# Patient Record
Sex: Male | Born: 1950 | Race: Black or African American | Hispanic: No | Marital: Married | Smoking: Current some day smoker
Health system: Southern US, Community
[De-identification: ages and names within clinical notes are randomized; demographics above are authoritative.]

## PROBLEM LIST (undated history)

## (undated) DIAGNOSIS — K759 Inflammatory liver disease, unspecified: Secondary | ICD-10-CM

## (undated) DIAGNOSIS — A159 Respiratory tuberculosis unspecified: Secondary | ICD-10-CM

## (undated) DIAGNOSIS — F1910 Other psychoactive substance abuse, uncomplicated: Secondary | ICD-10-CM

## (undated) DIAGNOSIS — A630 Anogenital (venereal) warts: Secondary | ICD-10-CM

## (undated) DIAGNOSIS — J42 Unspecified chronic bronchitis: Secondary | ICD-10-CM

## (undated) DIAGNOSIS — J189 Pneumonia, unspecified organism: Secondary | ICD-10-CM

## (undated) DIAGNOSIS — B20 Human immunodeficiency virus [HIV] disease: Secondary | ICD-10-CM

## (undated) DIAGNOSIS — M1990 Unspecified osteoarthritis, unspecified site: Secondary | ICD-10-CM

## (undated) HISTORY — PX: OTHER SURGICAL HISTORY: SHX169

---

## 1997-06-06 ENCOUNTER — Encounter (INDEPENDENT_AMBULATORY_CARE_PROVIDER_SITE_OTHER): Payer: Self-pay | Admitting: *Deleted

## 1997-06-06 LAB — CONVERTED CEMR LAB
CD4 Count: 130 microliters
CD4 T Cell Abs: 130

## 1997-09-08 ENCOUNTER — Encounter: Admission: RE | Admit: 1997-09-08 | Discharge: 1997-09-08 | Payer: Self-pay | Admitting: Internal Medicine

## 1997-10-06 ENCOUNTER — Encounter: Admission: RE | Admit: 1997-10-06 | Discharge: 1997-10-06 | Payer: Self-pay | Admitting: Internal Medicine

## 1997-12-09 ENCOUNTER — Encounter: Admission: RE | Admit: 1997-12-09 | Discharge: 1997-12-09 | Payer: Self-pay | Admitting: Internal Medicine

## 1998-07-22 ENCOUNTER — Encounter: Payer: Self-pay | Admitting: Internal Medicine

## 1998-07-22 ENCOUNTER — Inpatient Hospital Stay (HOSPITAL_COMMUNITY): Admission: RE | Admit: 1998-07-22 | Discharge: 1998-07-25 | Payer: Self-pay | Admitting: Internal Medicine

## 1998-07-22 ENCOUNTER — Encounter: Admission: RE | Admit: 1998-07-22 | Discharge: 1998-07-22 | Payer: Self-pay | Admitting: Internal Medicine

## 1999-03-02 ENCOUNTER — Ambulatory Visit (HOSPITAL_COMMUNITY): Admission: RE | Admit: 1999-03-02 | Discharge: 1999-03-02 | Payer: Self-pay | Admitting: Internal Medicine

## 2000-08-11 ENCOUNTER — Encounter: Admission: RE | Admit: 2000-08-11 | Discharge: 2000-08-11 | Payer: Self-pay | Admitting: Internal Medicine

## 2000-08-23 ENCOUNTER — Ambulatory Visit (HOSPITAL_COMMUNITY): Admission: RE | Admit: 2000-08-23 | Discharge: 2000-08-23 | Payer: Self-pay | Admitting: Internal Medicine

## 2000-12-06 ENCOUNTER — Encounter: Admission: RE | Admit: 2000-12-06 | Discharge: 2000-12-06 | Payer: Self-pay | Admitting: Internal Medicine

## 2000-12-09 ENCOUNTER — Emergency Department (HOSPITAL_COMMUNITY): Admission: EM | Admit: 2000-12-09 | Discharge: 2000-12-09 | Payer: Self-pay | Admitting: Emergency Medicine

## 2000-12-11 ENCOUNTER — Emergency Department (HOSPITAL_COMMUNITY): Admission: EM | Admit: 2000-12-11 | Discharge: 2000-12-11 | Payer: Self-pay | Admitting: Emergency Medicine

## 2000-12-26 ENCOUNTER — Emergency Department (HOSPITAL_COMMUNITY): Admission: EM | Admit: 2000-12-26 | Discharge: 2000-12-26 | Payer: Self-pay

## 2001-04-10 ENCOUNTER — Emergency Department (HOSPITAL_COMMUNITY): Admission: EM | Admit: 2001-04-10 | Discharge: 2001-04-10 | Payer: Self-pay | Admitting: Emergency Medicine

## 2001-04-10 ENCOUNTER — Encounter: Payer: Self-pay | Admitting: Emergency Medicine

## 2001-06-18 ENCOUNTER — Encounter: Admission: RE | Admit: 2001-06-18 | Discharge: 2001-06-18 | Payer: Self-pay | Admitting: Internal Medicine

## 2002-02-19 ENCOUNTER — Ambulatory Visit (HOSPITAL_COMMUNITY): Admission: RE | Admit: 2002-02-19 | Discharge: 2002-02-19 | Payer: Self-pay | Admitting: Internal Medicine

## 2002-02-19 ENCOUNTER — Encounter: Admission: RE | Admit: 2002-02-19 | Discharge: 2002-02-19 | Payer: Self-pay | Admitting: Internal Medicine

## 2002-03-05 ENCOUNTER — Encounter: Admission: RE | Admit: 2002-03-05 | Discharge: 2002-03-05 | Payer: Self-pay | Admitting: Internal Medicine

## 2002-10-15 ENCOUNTER — Encounter: Admission: RE | Admit: 2002-10-15 | Discharge: 2002-10-15 | Payer: Self-pay | Admitting: Internal Medicine

## 2002-10-15 ENCOUNTER — Encounter: Payer: Self-pay | Admitting: Internal Medicine

## 2002-12-03 ENCOUNTER — Encounter: Admission: RE | Admit: 2002-12-03 | Discharge: 2002-12-03 | Payer: Self-pay | Admitting: Internal Medicine

## 2002-12-28 ENCOUNTER — Emergency Department (HOSPITAL_COMMUNITY): Admission: EM | Admit: 2002-12-28 | Discharge: 2002-12-28 | Payer: Self-pay | Admitting: Emergency Medicine

## 2003-02-04 ENCOUNTER — Encounter: Admission: RE | Admit: 2003-02-04 | Discharge: 2003-02-04 | Payer: Self-pay | Admitting: Internal Medicine

## 2003-02-18 ENCOUNTER — Encounter: Admission: RE | Admit: 2003-02-18 | Discharge: 2003-02-18 | Payer: Self-pay | Admitting: Internal Medicine

## 2003-06-03 ENCOUNTER — Encounter: Admission: RE | Admit: 2003-06-03 | Discharge: 2003-06-03 | Payer: Self-pay | Admitting: Internal Medicine

## 2003-07-04 ENCOUNTER — Encounter: Admission: RE | Admit: 2003-07-04 | Discharge: 2003-07-04 | Payer: Self-pay | Admitting: Internal Medicine

## 2003-07-15 ENCOUNTER — Encounter: Admission: RE | Admit: 2003-07-15 | Discharge: 2003-07-15 | Payer: Self-pay | Admitting: Internal Medicine

## 2003-07-29 ENCOUNTER — Encounter: Admission: RE | Admit: 2003-07-29 | Discharge: 2003-07-29 | Payer: Self-pay | Admitting: Internal Medicine

## 2004-08-24 ENCOUNTER — Ambulatory Visit: Payer: Self-pay | Admitting: Internal Medicine

## 2004-08-24 ENCOUNTER — Ambulatory Visit (HOSPITAL_COMMUNITY): Admission: RE | Admit: 2004-08-24 | Discharge: 2004-08-24 | Payer: Self-pay | Admitting: Internal Medicine

## 2004-09-21 ENCOUNTER — Ambulatory Visit: Payer: Self-pay | Admitting: Internal Medicine

## 2004-09-26 ENCOUNTER — Emergency Department (HOSPITAL_COMMUNITY): Admission: EM | Admit: 2004-09-26 | Discharge: 2004-09-26 | Payer: Self-pay | Admitting: Emergency Medicine

## 2004-11-03 ENCOUNTER — Ambulatory Visit: Payer: Self-pay | Admitting: Internal Medicine

## 2004-11-03 ENCOUNTER — Ambulatory Visit (HOSPITAL_COMMUNITY): Admission: RE | Admit: 2004-11-03 | Discharge: 2004-11-03 | Payer: Self-pay | Admitting: Internal Medicine

## 2004-11-16 ENCOUNTER — Ambulatory Visit: Payer: Self-pay | Admitting: Internal Medicine

## 2005-03-08 ENCOUNTER — Encounter (INDEPENDENT_AMBULATORY_CARE_PROVIDER_SITE_OTHER): Payer: Self-pay | Admitting: *Deleted

## 2005-03-08 ENCOUNTER — Ambulatory Visit (HOSPITAL_COMMUNITY): Admission: RE | Admit: 2005-03-08 | Discharge: 2005-03-08 | Payer: Self-pay | Admitting: Internal Medicine

## 2005-03-08 ENCOUNTER — Ambulatory Visit: Payer: Self-pay | Admitting: Internal Medicine

## 2006-03-01 ENCOUNTER — Ambulatory Visit: Payer: Self-pay | Admitting: Internal Medicine

## 2006-03-01 ENCOUNTER — Encounter: Admission: RE | Admit: 2006-03-01 | Discharge: 2006-03-01 | Payer: Self-pay | Admitting: Internal Medicine

## 2006-03-01 ENCOUNTER — Encounter (INDEPENDENT_AMBULATORY_CARE_PROVIDER_SITE_OTHER): Payer: Self-pay | Admitting: *Deleted

## 2006-04-18 ENCOUNTER — Ambulatory Visit: Payer: Self-pay | Admitting: Internal Medicine

## 2006-04-25 ENCOUNTER — Encounter: Admission: RE | Admit: 2006-04-25 | Discharge: 2006-04-25 | Payer: Self-pay | Admitting: Internal Medicine

## 2006-07-03 ENCOUNTER — Encounter: Admission: RE | Admit: 2006-07-03 | Discharge: 2006-07-03 | Payer: Self-pay | Admitting: Internal Medicine

## 2006-07-03 ENCOUNTER — Ambulatory Visit: Payer: Self-pay | Admitting: Internal Medicine

## 2006-07-03 ENCOUNTER — Encounter (INDEPENDENT_AMBULATORY_CARE_PROVIDER_SITE_OTHER): Payer: Self-pay | Admitting: *Deleted

## 2006-07-03 DIAGNOSIS — F1910 Other psychoactive substance abuse, uncomplicated: Secondary | ICD-10-CM | POA: Insufficient documentation

## 2006-07-03 DIAGNOSIS — F17200 Nicotine dependence, unspecified, uncomplicated: Secondary | ICD-10-CM

## 2006-07-03 DIAGNOSIS — F528 Other sexual dysfunction not due to a substance or known physiological condition: Secondary | ICD-10-CM

## 2006-07-03 DIAGNOSIS — B182 Chronic viral hepatitis C: Secondary | ICD-10-CM

## 2006-07-03 DIAGNOSIS — Z8619 Personal history of other infectious and parasitic diseases: Secondary | ICD-10-CM

## 2006-07-03 DIAGNOSIS — Z8611 Personal history of tuberculosis: Secondary | ICD-10-CM

## 2006-07-03 DIAGNOSIS — A630 Anogenital (venereal) warts: Secondary | ICD-10-CM

## 2006-07-03 DIAGNOSIS — B20 Human immunodeficiency virus [HIV] disease: Secondary | ICD-10-CM | POA: Insufficient documentation

## 2006-07-03 LAB — CONVERTED CEMR LAB
Albumin: 3.3 g/dL — ABNORMAL LOW (ref 3.5–5.2)
Basophils Absolute: 0 10*3/uL (ref 0.0–0.1)
CD4 Count: 180 microliters
CO2: 26 meq/L (ref 19–32)
Calcium: 8.9 mg/dL (ref 8.4–10.5)
Chloride: 105 meq/L (ref 96–112)
Eosinophils Absolute: 0.2 10*3/uL (ref 0.0–0.7)
Glucose, Bld: 111 mg/dL — ABNORMAL HIGH (ref 70–99)
HCT: 34.7 % — ABNORMAL LOW (ref 39.0–52.0)
HDL: 34 mg/dL — ABNORMAL LOW (ref >39)
HIV 1 RNA Quant: 56 copies/mL
Lymphocytes Relative: 42 % (ref 12–46)
Neutrophils Relative %: 40 % — ABNORMAL LOW (ref 43–77)
Platelets: 178 10*3/uL (ref 150–400)
Potassium: 4.5 meq/L (ref 3.5–5.3)
RDW: 12.9 % (ref 11.5–14.0)
Total Bilirubin: 0.6 mg/dL (ref 0.3–1.2)
Total CHOL/HDL Ratio: 3.6
Triglycerides: 116 mg/dL (ref <150)

## 2006-07-17 ENCOUNTER — Encounter: Payer: Self-pay | Admitting: Internal Medicine

## 2006-07-31 ENCOUNTER — Encounter (INDEPENDENT_AMBULATORY_CARE_PROVIDER_SITE_OTHER): Payer: Self-pay | Admitting: *Deleted

## 2006-07-31 LAB — CONVERTED CEMR LAB

## 2006-08-13 ENCOUNTER — Encounter (INDEPENDENT_AMBULATORY_CARE_PROVIDER_SITE_OTHER): Payer: Self-pay | Admitting: *Deleted

## 2006-09-27 ENCOUNTER — Telehealth: Payer: Self-pay | Admitting: Internal Medicine

## 2006-10-28 ENCOUNTER — Emergency Department (HOSPITAL_COMMUNITY): Admission: EM | Admit: 2006-10-28 | Discharge: 2006-10-28 | Payer: Self-pay | Admitting: *Deleted

## 2006-11-01 ENCOUNTER — Ambulatory Visit: Payer: Self-pay | Admitting: Internal Medicine

## 2006-11-01 ENCOUNTER — Encounter: Admission: RE | Admit: 2006-11-01 | Discharge: 2006-11-01 | Payer: Self-pay | Admitting: Internal Medicine

## 2006-11-09 ENCOUNTER — Emergency Department (HOSPITAL_COMMUNITY): Admission: EM | Admit: 2006-11-09 | Discharge: 2006-11-09 | Payer: Self-pay | Admitting: Emergency Medicine

## 2006-11-13 ENCOUNTER — Encounter (INDEPENDENT_AMBULATORY_CARE_PROVIDER_SITE_OTHER): Payer: Self-pay | Admitting: *Deleted

## 2007-04-12 ENCOUNTER — Telehealth: Payer: Self-pay | Admitting: Internal Medicine

## 2007-05-10 ENCOUNTER — Encounter (INDEPENDENT_AMBULATORY_CARE_PROVIDER_SITE_OTHER): Payer: Self-pay | Admitting: *Deleted

## 2007-05-29 ENCOUNTER — Telehealth: Payer: Self-pay | Admitting: Internal Medicine

## 2007-07-23 ENCOUNTER — Ambulatory Visit: Payer: Self-pay | Admitting: Internal Medicine

## 2007-07-23 ENCOUNTER — Encounter: Admission: RE | Admit: 2007-07-23 | Discharge: 2007-07-23 | Payer: Self-pay | Admitting: Internal Medicine

## 2007-07-23 LAB — CONVERTED CEMR LAB
AST: 74 units/L — ABNORMAL HIGH (ref 0–37)
Albumin: 4 g/dL (ref 3.5–5.2)
Basophils Absolute: 0 10*3/uL (ref 0.0–0.1)
CO2: 23 meq/L (ref 19–32)
Eosinophils Relative: 3 % (ref 0–5)
HDL: 34 mg/dL — ABNORMAL LOW (ref >39)
HIV 1 RNA Quant: <50 copies/mL (ref <50)
HIV-1 RNA Quant, Log: <1.7 (ref <1.70)
LDL Cholesterol: 53 mg/dL (ref 0–99)
Leukocytes, UA: NEGATIVE
MCV: 103.3 fL — ABNORMAL HIGH (ref 78.0–100.0)
Monocytes Absolute: 0.6 10*3/uL (ref 0.1–1.0)
Monocytes Relative: 10 % (ref 3–12)
Neutrophils Relative %: 43 % (ref 43–77)
Nitrite: NEGATIVE
Platelets: 236 10*3/uL (ref 150–400)
Potassium: 4.9 meq/L (ref 3.5–5.3)
Protein, ur: 30 mg/dL — AB
RBC / HPF: NONE SEEN (ref <3)
Total Bilirubin: 0.5 mg/dL (ref 0.3–1.2)
Total CHOL/HDL Ratio: 3.6
Urobilinogen, UA: 0.2 (ref 0.0–1.0)
VLDL: 35 mg/dL (ref 0–40)
WBC: 5.5 10*3/uL (ref 4.0–10.5)
pH: 6 (ref 5.0–8.0)

## 2007-07-31 ENCOUNTER — Emergency Department (HOSPITAL_COMMUNITY): Admission: EM | Admit: 2007-07-31 | Discharge: 2007-07-31 | Payer: Self-pay | Admitting: Emergency Medicine

## 2007-08-21 ENCOUNTER — Ambulatory Visit: Payer: Self-pay | Admitting: Internal Medicine

## 2007-10-02 ENCOUNTER — Encounter: Payer: Self-pay | Admitting: Internal Medicine

## 2007-11-07 ENCOUNTER — Ambulatory Visit: Payer: Self-pay | Admitting: Internal Medicine

## 2007-11-07 ENCOUNTER — Encounter: Admission: RE | Admit: 2007-11-07 | Discharge: 2007-11-07 | Payer: Self-pay | Admitting: Internal Medicine

## 2007-11-07 LAB — CONVERTED CEMR LAB
Albumin: 4 g/dL (ref 3.5–5.2)
CO2: 20 meq/L (ref 19–32)
Calcium: 8.4 mg/dL (ref 8.4–10.5)
Chloride: 110 meq/L (ref 96–112)
Cholesterol: 106 mg/dL (ref 0–200)
Glucose, Bld: 84 mg/dL (ref 70–99)
HIV 1 RNA Quant: <50 copies/mL (ref <50)
HIV-1 RNA Quant, Log: <1.7 (ref <1.70)
Platelets: 186 10*3/uL (ref 150–400)
RDW: 13.8 % (ref 11.5–15.5)
Sodium: 140 meq/L (ref 135–145)
Total Bilirubin: 0.4 mg/dL (ref 0.3–1.2)
Total CHOL/HDL Ratio: 2.8

## 2007-11-27 ENCOUNTER — Ambulatory Visit: Payer: Self-pay | Admitting: Internal Medicine

## 2008-03-14 ENCOUNTER — Ambulatory Visit: Payer: Self-pay | Admitting: Internal Medicine

## 2008-03-14 LAB — CONVERTED CEMR LAB: HCV Quantitative: 8340000 intl units/mL — ABNORMAL HIGH (ref <43)

## 2008-05-07 ENCOUNTER — Encounter: Payer: Self-pay | Admitting: Internal Medicine

## 2008-05-20 ENCOUNTER — Ambulatory Visit: Payer: Self-pay | Admitting: Internal Medicine

## 2008-05-20 DIAGNOSIS — M65849 Other synovitis and tenosynovitis, unspecified hand: Secondary | ICD-10-CM

## 2008-05-20 DIAGNOSIS — M65839 Other synovitis and tenosynovitis, unspecified forearm: Secondary | ICD-10-CM

## 2008-05-23 ENCOUNTER — Emergency Department (HOSPITAL_COMMUNITY): Admission: EM | Admit: 2008-05-23 | Discharge: 2008-05-23 | Payer: Self-pay | Admitting: Emergency Medicine

## 2008-05-26 ENCOUNTER — Telehealth: Payer: Self-pay | Admitting: Internal Medicine

## 2008-05-31 ENCOUNTER — Emergency Department (HOSPITAL_COMMUNITY): Admission: EM | Admit: 2008-05-31 | Discharge: 2008-05-31 | Payer: Self-pay | Admitting: Emergency Medicine

## 2008-06-02 ENCOUNTER — Encounter: Payer: Self-pay | Admitting: Internal Medicine

## 2008-06-13 ENCOUNTER — Telehealth: Payer: Self-pay | Admitting: Internal Medicine

## 2008-09-16 ENCOUNTER — Inpatient Hospital Stay (HOSPITAL_COMMUNITY): Admission: AC | Admit: 2008-09-16 | Discharge: 2008-09-18 | Payer: Self-pay

## 2008-09-17 ENCOUNTER — Ambulatory Visit: Payer: Self-pay | Admitting: Infectious Diseases

## 2008-09-17 ENCOUNTER — Encounter: Payer: Self-pay | Admitting: Internal Medicine

## 2008-10-03 ENCOUNTER — Encounter: Admission: RE | Admit: 2008-10-03 | Discharge: 2008-10-03 | Payer: Self-pay | Admitting: Pulmonary Disease

## 2008-11-10 ENCOUNTER — Ambulatory Visit: Payer: Self-pay | Admitting: Internal Medicine

## 2008-11-10 LAB — CONVERTED CEMR LAB
ALT: 70 units/L — ABNORMAL HIGH (ref 0–53)
Albumin: 3.8 g/dL (ref 3.5–5.2)
Calcium: 8.7 mg/dL (ref 8.4–10.5)
Chloride: 107 meq/L (ref 96–112)
Cholesterol: 121 mg/dL (ref 0–200)
Eosinophils Absolute: 0.3 10*3/uL (ref 0.0–0.7)
GFR calc non Af Amer: >60 mL/min (ref >60)
Glucose, Bld: 81 mg/dL (ref 70–99)
HDL: 44 mg/dL (ref >39)
Lymphocytes Relative: 40 % (ref 12–46)
MCHC: 32.8 g/dL (ref 30.0–36.0)
MCV: 101.6 fL — ABNORMAL HIGH (ref 78.0–100.0)
Neutro Abs: 2.2 10*3/uL (ref 1.7–7.7)
Neutrophils Relative %: 42 % — ABNORMAL LOW (ref 43–77)
Potassium: 5.6 meq/L — ABNORMAL HIGH (ref 3.5–5.3)
RDW: 13.1 % (ref 11.5–15.5)
Total Bilirubin: 0.3 mg/dL (ref 0.3–1.2)
Total CHOL/HDL Ratio: 2.8
Total Protein: 7.2 g/dL (ref 6.0–8.3)
WBC: 5.2 10*3/uL (ref 4.0–10.5)

## 2008-11-26 ENCOUNTER — Encounter: Payer: Self-pay | Admitting: Internal Medicine

## 2008-12-04 ENCOUNTER — Ambulatory Visit: Payer: Self-pay | Admitting: Internal Medicine

## 2008-12-04 DIAGNOSIS — A319 Mycobacterial infection, unspecified: Secondary | ICD-10-CM | POA: Insufficient documentation

## 2009-02-02 ENCOUNTER — Telehealth (INDEPENDENT_AMBULATORY_CARE_PROVIDER_SITE_OTHER): Payer: Self-pay | Admitting: *Deleted

## 2009-02-03 ENCOUNTER — Telehealth: Payer: Self-pay | Admitting: Internal Medicine

## 2009-02-17 ENCOUNTER — Ambulatory Visit: Payer: Self-pay | Admitting: Internal Medicine

## 2009-02-17 LAB — CONVERTED CEMR LAB
ALT: 26 units/L (ref 0–53)
AST: 41 units/L — ABNORMAL HIGH (ref 0–37)
Alkaline Phosphatase: 91 units/L (ref 39–117)
BUN: 10 mg/dL (ref 6–23)
Creatinine, Ser: 0.92 mg/dL (ref 0.40–1.50)
Eosinophils Relative: 2 % (ref 0–5)
Glucose, Bld: 71 mg/dL (ref 70–99)
HIV-1 RNA Quant, Log: <1.68 (ref <1.68)
Hemoglobin: 10.3 g/dL — ABNORMAL LOW (ref 13.0–17.0)
Lymphocytes Relative: 55 % — ABNORMAL HIGH (ref 12–46)
Lymphs Abs: 4.9 10*3/uL — ABNORMAL HIGH (ref 0.7–4.0)
MCHC: 31.5 g/dL (ref 30.0–36.0)
Monocytes Absolute: 1.3 10*3/uL — ABNORMAL HIGH (ref 0.1–1.0)
Monocytes Relative: 15 % — ABNORMAL HIGH (ref 3–12)
Neutro Abs: 2.5 10*3/uL (ref 1.7–7.7)
RBC: 3.22 M/uL — ABNORMAL LOW (ref 4.22–5.81)
WBC: 8.8 10*3/uL (ref 4.0–10.5)

## 2009-03-03 ENCOUNTER — Ambulatory Visit: Payer: Self-pay | Admitting: Internal Medicine

## 2009-03-05 ENCOUNTER — Telehealth (INDEPENDENT_AMBULATORY_CARE_PROVIDER_SITE_OTHER): Payer: Self-pay | Admitting: *Deleted

## 2009-05-25 ENCOUNTER — Telehealth (INDEPENDENT_AMBULATORY_CARE_PROVIDER_SITE_OTHER): Payer: Self-pay | Admitting: *Deleted

## 2009-07-19 ENCOUNTER — Emergency Department (HOSPITAL_COMMUNITY): Admission: EM | Admit: 2009-07-19 | Discharge: 2009-07-19 | Payer: Self-pay | Admitting: Emergency Medicine

## 2009-08-08 ENCOUNTER — Emergency Department (HOSPITAL_COMMUNITY): Admission: EM | Admit: 2009-08-08 | Discharge: 2009-08-08 | Payer: Self-pay | Admitting: Emergency Medicine

## 2009-08-10 ENCOUNTER — Telehealth: Payer: Self-pay | Admitting: Infectious Diseases

## 2009-08-14 ENCOUNTER — Ambulatory Visit: Payer: Self-pay | Admitting: Internal Medicine

## 2009-09-14 ENCOUNTER — Ambulatory Visit: Payer: Self-pay | Admitting: Internal Medicine

## 2009-09-14 LAB — CONVERTED CEMR LAB
Alkaline Phosphatase: 102 units/L (ref 39–117)
CO2: 24 meq/L (ref 19–32)
Creatinine, Ser: 0.85 mg/dL (ref 0.40–1.50)
Eosinophils Absolute: 0.2 10*3/uL (ref 0.0–0.7)
Eosinophils Relative: 1 % (ref 0–5)
Glucose, Bld: 89 mg/dL (ref 70–99)
HCT: 35.8 % — ABNORMAL LOW (ref 39.0–52.0)
HDL: 28 mg/dL — ABNORMAL LOW (ref >39)
Hemoglobin: 11.9 g/dL — ABNORMAL LOW (ref 13.0–17.0)
Lymphs Abs: 6.4 10*3/uL — ABNORMAL HIGH (ref 0.7–4.0)
MCV: 96.2 fL (ref 78.0–100.0)
Monocytes Absolute: 1.3 10*3/uL — ABNORMAL HIGH (ref 0.1–1.0)
Monocytes Relative: 12 % (ref 3–12)
Neutrophils Relative %: 26 % — ABNORMAL LOW (ref 43–77)
RBC: 3.72 M/uL — ABNORMAL LOW (ref 4.22–5.81)
Total Bilirubin: 0.5 mg/dL (ref 0.3–1.2)
Total CHOL/HDL Ratio: 4.6
VLDL: 35 mg/dL (ref 0–40)
WBC: 11 10*3/uL — ABNORMAL HIGH (ref 4.0–10.5)

## 2009-09-21 ENCOUNTER — Telehealth: Payer: Self-pay | Admitting: Internal Medicine

## 2010-01-13 ENCOUNTER — Ambulatory Visit: Payer: Self-pay | Admitting: Internal Medicine

## 2010-01-13 LAB — CONVERTED CEMR LAB
AST: 43 units/L — ABNORMAL HIGH (ref 0–37)
Alkaline Phosphatase: 92 units/L (ref 39–117)
BUN: 12 mg/dL (ref 6–23)
Basophils Relative: 0 % (ref 0–1)
Creatinine, Ser: 1.07 mg/dL (ref 0.40–1.50)
Eosinophils Absolute: 0.1 10*3/uL (ref 0.0–0.7)
HDL: 29 mg/dL — ABNORMAL LOW (ref >39)
LDL Cholesterol: 71 mg/dL (ref 0–99)
MCHC: 31.6 g/dL (ref 30.0–36.0)
MCV: 104.6 fL — ABNORMAL HIGH (ref 78.0–100.0)
Monocytes Absolute: 0.9 10*3/uL (ref 0.1–1.0)
Monocytes Relative: 12 % (ref 3–12)
Neutrophils Relative %: 24 % — ABNORMAL LOW (ref 43–77)
Potassium: 5.1 meq/L (ref 3.5–5.3)
RBC: 3.69 M/uL — ABNORMAL LOW (ref 4.22–5.81)
Total CHOL/HDL Ratio: 4.9
VLDL: 42 mg/dL — ABNORMAL HIGH (ref 0–40)

## 2010-02-16 ENCOUNTER — Ambulatory Visit: Payer: Self-pay | Admitting: Internal Medicine

## 2010-04-27 IMAGING — CR DG ABDOMEN ACUTE W/ 1V CHEST
4 series · 4 of 4 positions shown · non-contrast
Comparison: Chest x-ray of [DATE]

CLINICAL DATA: Distended abdomen, evaluate for constipation

ACUTE ABDOMEN SERIES (ABDOMEN 2 VIEW & CHEST 1 VIEW)

[w chest pa]
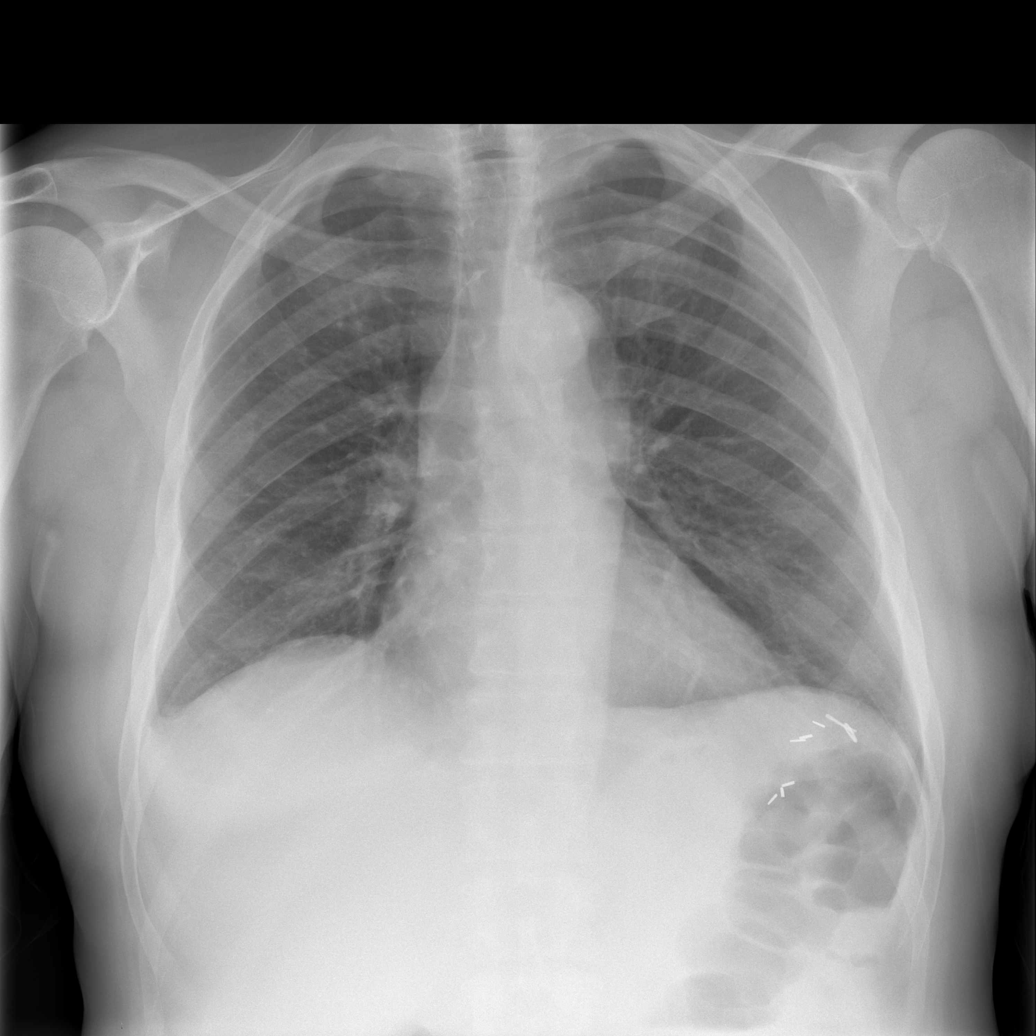

[w abdomen upright *]
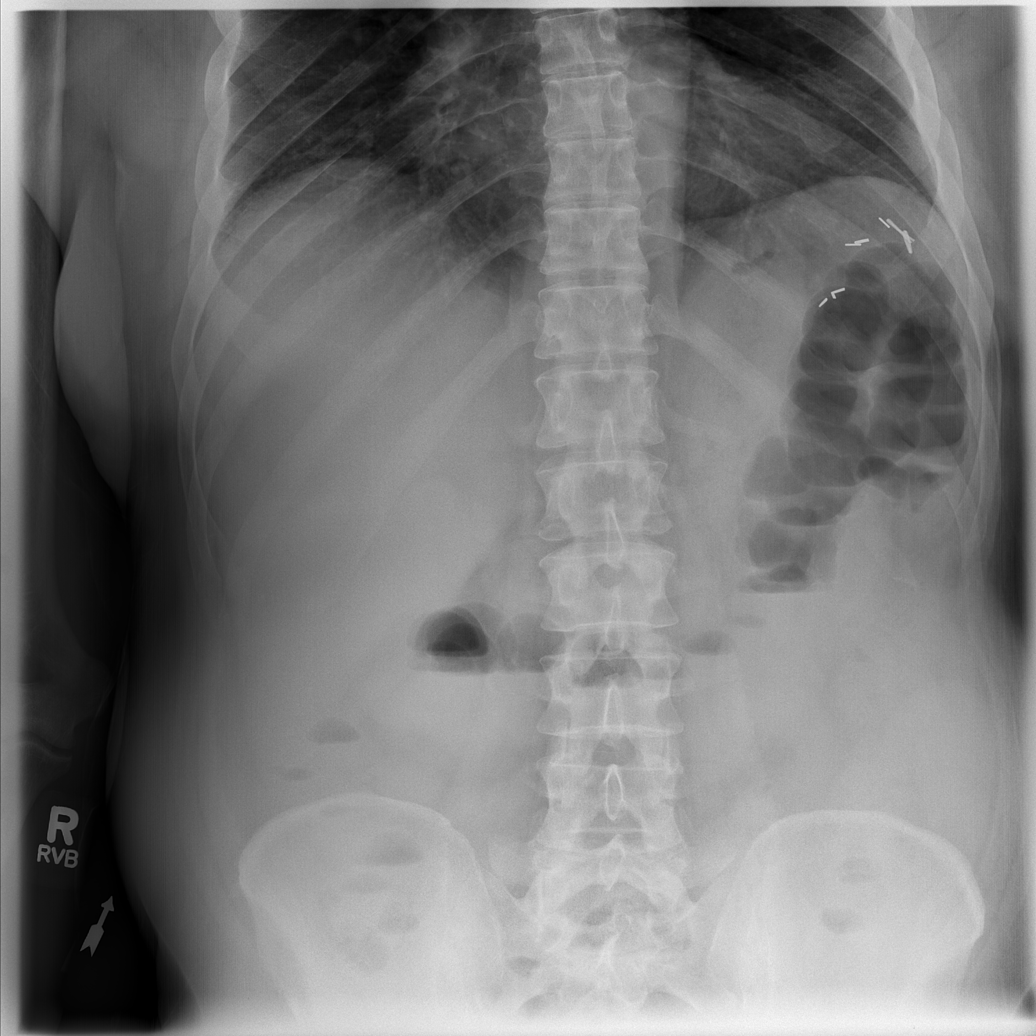

[t abdomen supine (1 of 2)]
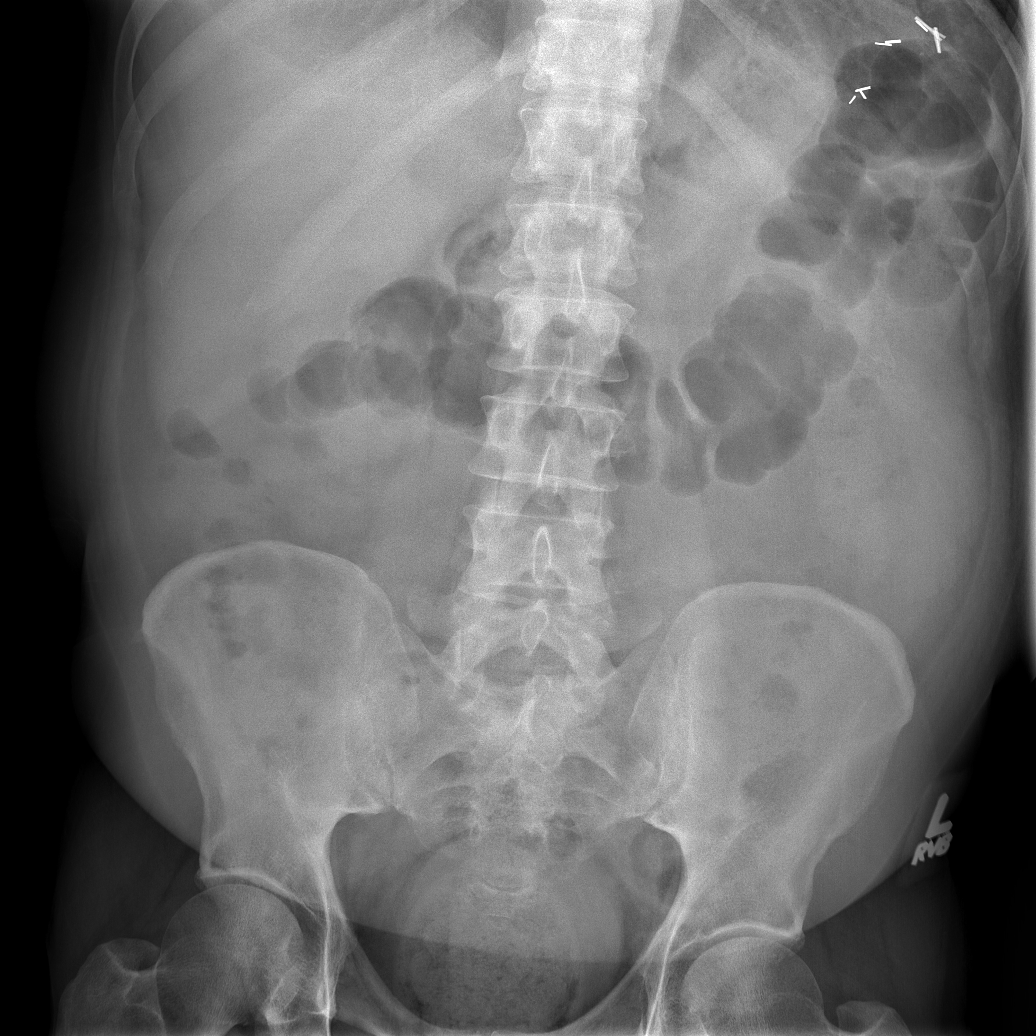

[t abdomen supine (2 of 2)]
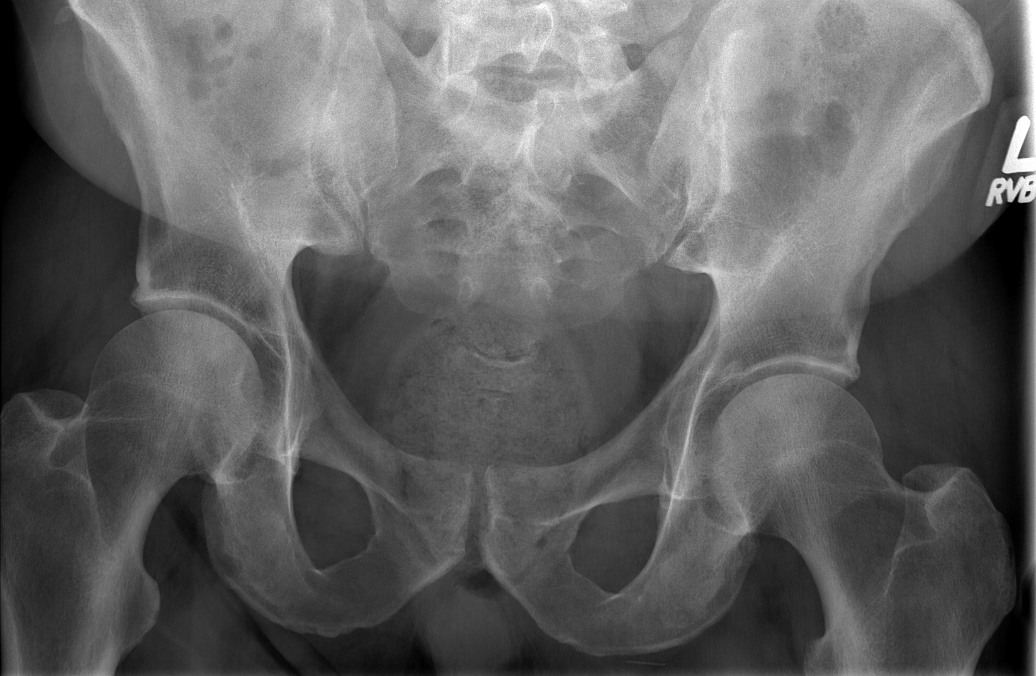

[4 of 4 positions shown; findings below may reference images not displayed]

FINDINGS: No active infiltrate or effusion is seen.  Mediastinal
contours are stable.  The heart is within normal limits in size.
Surgical clips are noted in the left upper quadrant.

Supine and erect views of the abdomen show scattered air-fluid
levels throughout the colon most consistent with diarrhea or recent
enemas.  No definite constipation is seen, with only a moderate
amount of feces noted in the region of the rectum.  No free air is
seen.
IMPRESSION: 1.  No active lung disease.
2.  No obstruction or free air.
3.  Scattered air-fluid levels primarily in the colon may be due to
recent enemas or diarrhea.  Moderate amount of feces in the rectum.

## 2010-05-20 ENCOUNTER — Ambulatory Visit: Payer: Self-pay | Admitting: Internal Medicine

## 2010-06-01 ENCOUNTER — Ambulatory Visit: Payer: Self-pay | Admitting: Internal Medicine

## 2010-06-02 ENCOUNTER — Encounter: Payer: Self-pay | Admitting: Internal Medicine

## 2010-06-02 LAB — CONVERTED CEMR LAB: HIV-1 RNA Quant, Log: <1.3 (ref <1.30)

## 2010-06-09 ENCOUNTER — Emergency Department (HOSPITAL_COMMUNITY)
Admission: EM | Admit: 2010-06-09 | Discharge: 2010-06-09 | Payer: Self-pay | Source: Home / Self Care | Admitting: Emergency Medicine

## 2010-06-10 ENCOUNTER — Encounter: Payer: Self-pay | Admitting: Internal Medicine

## 2010-06-15 DIAGNOSIS — S72009A Fracture of unspecified part of neck of unspecified femur, initial encounter for closed fracture: Secondary | ICD-10-CM | POA: Insufficient documentation

## 2010-06-27 ENCOUNTER — Encounter: Payer: Self-pay | Admitting: Internal Medicine

## 2010-07-04 LAB — CONVERTED CEMR LAB
ALT: 51 units/L (ref 0–53)
Albumin: 3.9 g/dL (ref 3.5–5.2)
Alkaline Phosphatase: 121 units/L — ABNORMAL HIGH (ref 39–117)
Basophils Absolute: 0 10*3/uL (ref 0.0–0.1)
Basophils Relative: 1 % (ref 0–1)
CD4 Count: 210 microliters
CO2: 24 meq/L (ref 19–32)
Calcium: 8.4 mg/dL (ref 8.4–10.5)
Chloride: 107 meq/L (ref 96–112)
Eosinophils Absolute: 0.1 10*3/uL (ref 0.0–0.7)
HCT: 35.6 % — ABNORMAL LOW (ref 39.0–52.0)
HIV 1 RNA Quant: <50 copies/mL (ref <50)
Hemoglobin: 11.9 g/dL — ABNORMAL LOW (ref 13.0–17.0)
MCHC: 33.4 g/dL (ref 30.0–36.0)
Monocytes Absolute: 0.6 10*3/uL (ref 0.2–0.7)
Platelets: 177 10*3/uL (ref 150–400)
Potassium: 4.3 meq/L (ref 3.5–5.3)
RDW: 13 % (ref 11.5–14.0)
Sodium: 139 meq/L (ref 135–145)

## 2010-07-05 ENCOUNTER — Emergency Department (HOSPITAL_COMMUNITY)
Admission: EM | Admit: 2010-07-05 | Discharge: 2010-07-05 | Payer: Self-pay | Source: Home / Self Care | Admitting: Emergency Medicine

## 2010-07-08 NOTE — Consult Note (Signed)
Summary: [facility]   Imported By: Florinda Marker 06/21/2010 14:08:32  _____________________________________________________________________  External Attachment:    Type:   Image     Comment:   External Document

## 2010-07-08 NOTE — Progress Notes (Signed)
Summary: Call from ED for HA body aches  Phone Note Other Incoming   Summary of Call: Call from ED on Saturday night - pt presented with c/o HA, abd pain and body aches for several weeks.  No fevers. BW and CT abd without majorfindings.  I advised to have pt call for follow up in next 2-3 days Initial call taken by: Clydie Braun MD,  August 10, 2009 8:51 AM     Appended Document: Call from ED for HA body aches To see Dr. Philipp Deputy 08/14/2009.

## 2010-07-08 NOTE — Assessment & Plan Note (Signed)
Summary: [name]   CC:  follow-up visit, "stomach feels funny after taking rxes, " boil between buttocks, rash on right hand, and needing something for ED.  History of Present Illness: [name] is in for his routine visit. He says that he has been seeing "a right smart number of his medicines".  He frequently leaves home without his medicines and ends up staying overnight at his mother's house causing him to miss his evening dose of medications.  He says that last week he started carrying some of his medications and his toe bag everywhere he goes.  He thinks he is going to run out of the blue medication (Viread) today.  Preventive Screening-Counseling & Management  Alcohol-Tobacco     Alcohol drinks/day: occassionally     Alcohol type: beer     Smoking Status: current     Smoking Cessation Counseling: yes     Packs/Day: 1.0     Year Quit: 2010     Passive Smoke Exposure: yes  Caffeine-Diet-Exercise     Caffeine use/day: sodas     Does Patient Exercise: yes     Type of exercise: walking everywhere     Exercise (avg: min/session): >60     Times/week: 7  Hep-HIV-STD-Contraception     HIV Risk: no risk noted     HIV Risk Counseling: not indicated-no HIV risk noted  Safety-Violence-Falls     Seat Belt Use: yes  Comments: given condoms      Sexual History:  currently monogamous.        Drug Use:  former.     Prior Medication List:  RETROVIR 300 MG TABS (ZIDOVUDINE) Take 1 tablet by mouth two times a day VIREAD 300 MG TABS (TENOFOVIR DISOPROXIL FUMARATE) Take 1 tablet by mouth once a day KALETRA 200-50 MG TABS (LOPINAVIR-RITONAVIR) Take 2 tablets by mouth two times a day ENSURE  LIQD (NUTRITIONAL SUPPLEMENTS) one three times a day ULTRAM 50 MG TABS (TRAMADOL HCL) Take 1 tablet by mouth every 8 hours as needed MULTIVITAMINS  TABS (MULTIPLE VITAMIN) Take 1 tablet by mouth once a day   Current Allergies (reviewed today): No known allergies   Vital Signs:  Patient profile:    60 year old male Height:      71 inches (180.34 cm) Weight:      155.25 pounds (70.57 kg) BMI:     21.73 Temp:     98.8 degrees F (37.11 degrees C) oral Pulse rate:   79 / minute BP sitting:   113 / 79  (left arm) Cuff size:   regular  Vitals Entered By: Jennet Maduro RN (February 16, 2010 8:49 AM) CC: follow-up visit, "stomach feels funny after taking rxes," boil between buttocks, rash on right hand, needing something for ED Is Patient Diabetic? No Pain Assessment Patient in pain? no      Nutritional Status BMI of 19 -24 = normal  Have you ever been in a relationship where you felt threatened, hurt or afraid?No   Does patient need assistance? Functional Status Self care Ambulation Normal Comments missed doses   Physical Exam  General:  alert and overweight-appearing.   Mouth:  pharynx pink and moist, no erythema, no exudates, and poor dentition.   Lungs:  normal breath sounds, no crackles, and no wheezes.   Heart:  normal rate, regular rhythm, and no murmur.   Abdomen:  soft, normal bowel sounds, no masses. less distended than at last visit. Rectal:  large perianal wart  Medication Adherence: 02/16/2010   Adherence to medications reviewed with patient. Counseling to provide adequate adherence provided   Prevention For Positives: 02/16/2010   Safe sex practices discussed with patient. Condoms offered.                             Impression & Recommendations:  Problem # 1:  HIV DISEASE (ICD-042) Despite poor adherence recently his viral load remains undetectable in his CD4 count is well in the normal range.  I have encouraged him to try not to miss a single dose of his medications.  I will have him meet with her medication assistance coordinator today to see when he will have his Viread refilled. 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 Orders: Est. Patient Level III (16109)  Other Orders: Influenza Vaccine MCR (60454) Surgical Referral (Surgery) Future Orders: T-CD4SP ([facility]) (CD4SP) ... 05/17/2010 T-HIV Viral Load [phone]) ... 05/17/2010  Patient Instructions: 1)  Please schedule a follow-up appointment in 3 months.  Prevention & Chronic Care Immunizations   Influenza vaccine: Fluvax MCR  (02/16/2010)    Tetanus booster: Not documented    Pneumococcal vaccine: Pneumovax  (08/21/2007)  Colorectal Screening   Hemoccult: Not documented    Colonoscopy: Not documented  Other Screening   PSA: Not documented   Smoking status: current  (02/16/2010)   Smoking cessation counseling: yes  (02/16/2010)  Lipids   Total Cholesterol: 142  (01/13/2010)   LDL: 71  (01/13/2010)   LDL Direct: Not documented   HDL: 29  (01/13/2010)   Triglycerides: 212  (01/13/2010)    Influenza Vaccine    Vaccine Type: Fluvax MCR    Site: left deltoid    Mfr: novartis    Dose: 0.5 ml    Route: IM    Given by: Jennet Maduro RN    Exp. Date: 09/05/2010    Lot #: [id number]  Flu Vaccine Consent Questions    Do you have a history of severe allergic reactions to this vaccine? no    Any prior history of allergic reactions to egg and/or gelatin? no    Do you have a sensitivity to the preservative Thimersol? no    Do you have a past history of Guillan-Barre Syndrome? no    Do you currently have an acute febrile illness? no    Have you ever had a severe reaction to latex? no    Vaccine information given and explained to patient? yes   Appended Document: [name] nurse brought patient to [facility] because patient said he was having trouble obtaining his Viread.  I called his pharmacy, [facility]; he just needed refills on all of his ARV's.  Refills were  called in for patient and he will begin receiving his medication on a monthly basis again.

## 2010-07-08 NOTE — Assessment & Plan Note (Signed)
Summary: ED f/u visit /dde   CC:  ED follow up .  History of Present Illness: Pt c/o generalized body pain.  He went to the ED about a week ago for it and the w/u showed a slightly elevated CPK and positive drug screen for cocaine. He was given pain medication but he did not fill it because he did not have the money.  He is currently usint [facility] to get his meds.  He also has a bump in his rectal area that bothers him.  It is sometimes difficult to have a bowel movement and it sometimes bleeds.  He also wants to get some counseling because he is having trouble with the people he lives with.  He has been moody and has trouble concentrating. He was off his HIV meds for about 3 weeks but he is back on now.  Preventive Screening-Counseling & Management  Alcohol-Tobacco     Alcohol drinks/day: occassionally     Alcohol type: beer     Smoking Status: current     Smoking Cessation Counseling: yes     Packs/Day: 0.5     Year Quit: 2010     Passive Smoke Exposure: yes  Caffeine-Diet-Exercise     Caffeine use/day: sodas     Does Patient Exercise: no  Safety-Violence-Falls     Seat Belt Use: yes   Updated Prior Medication List: RETROVIR 300 MG TABS (ZIDOVUDINE) Take 1 tablet by mouth two times a day VIREAD 300 MG TABS (TENOFOVIR DISOPROXIL FUMARATE) Take 1 tablet by mouth once a day KALETRA 200-50 MG TABS (LOPINAVIR-RITONAVIR) Take 2 tablets by mouth two times a day ENSURE  LIQD (NUTRITIONAL SUPPLEMENTS) one three times a day ULTRAM 50 MG TABS (TRAMADOL HCL) Take 1 tablet by mouth every 8 hours as needed  Current Allergies (reviewed today): No known allergies  Past History:  Past Medical History: Last updated: 07/03/2006 Hepatitis B, hx of Hepatitis C HIV disease  Review of Systems       The patient complains of headaches.  The patient denies fever, prolonged cough, and abdominal pain.    Vital Signs:  Patient profile:   60 year old male Height:      71 inches (180.34  cm) Weight:      167.1 pounds (75.95 kg) BMI:     23.39 Temp:     97.2 degrees F (36.22 degrees C) oral Pulse rate:   88 / minute BP sitting:   120 / 75  (left arm)  Vitals Entered By: Baxter Hire) (August 14, 2009 1:56 PM) CC: ED follow up  Pain Assessment Patient in pain? yes     Location: body joints Intensity: 10 Type: aching Onset of pain  Constant pain for the past 3 months Nutritional Status BMI of 19 -24 = normal Nutritional Status Detail appetite is good per patient  Have you ever been in a relationship where you felt threatened, hurt or afraid?No   Does patient need assistance? Functional Status Self care Ambulation Normal   Physical Exam  General:  alert and disheveled.  alert.   Head:  normocephalic and atraumatic.  normocephalic and atraumatic.   Mouth:  pharynx pink and moist.  pharynx pink and moist.   Lungs:  normal breath sounds.  normal breath sounds.   Rectal:  pt has an anal wart    Impression & Recommendations:  Problem # 1:  MYALGIA (ICD-729.1)  will check CPK again and treat with ultram His updated medication list for this  problem includes:    Ultram 50 Mg Tabs (Tramadol hcl) .Marland Kitchen... Take 1 tablet by mouth every 8 hours as needed    His updated medication list for this problem includes:    Ultram 50 Mg Tabs (Tramadol hcl) .Marland Kitchen... 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 Patient Level III (62130) Surgical Referral (Surgery)  Medications Added to Medication List This Visit: 1)  Ultram 50 Mg Tabs (Tramadol hcl) .... Take 1 tablet by mouth every 8 hours as needed Prescriptions: ULTRAM 50 MG TABS (TRAMADOL HCL) Take 1 tablet by mouth every 8 hours as needed  #60 x 0   Entered and Authorized by:   Yisroel Ramming MD   Signed by:   Yisroel Ramming MD on 08/14/2009   Method used:   Print then Give to Patient   RxID:    [id number]  Process Orders Check Orders Results:     [facility]: ABN not required for this insurance Tests Sent for requisitioning (August 17, 2009 2:36 PM):     08/14/2009: [facility] -- T-CK Total [[id number]] (signed)

## 2010-07-08 NOTE — Progress Notes (Signed)
Summary: patient no showed for ultrasound  Phone Note From Other Clinic   Caller: [facility] Reason for Call: Patient [facility] Summary of Call: Patient was a no show for abdominal ultrasound . Initial call taken by: Starleen Arms CMA,  September 21, 2009 10:52 AM

## 2010-07-08 NOTE — Assessment & Plan Note (Signed)
Summary: F/U APPT/VS   CC:  follow-up visit, bones and muscles started "shut down" after Christmas and started back in February, and moving much better now.  History of Present Illness: [name] is in for a rescheduled visit.  He has missed several recent visits.  He has been using crack intermittently and continues to drink alcohol frequently.  He was recently in the emergency department complaining of myalgias and was found to have moderate elevations of his CPK levels in the 500 range.  His urine drug screen was positive for cocaine.  He is still living with his mother-in-law although he is not supposed to be living there.  His wife/girlfriend is also there is well and has at least one grandchild.  He also continues to smoke cigarettes.  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  He says he was off all of his medications for several months but restarted them in late February.  He does recall missing several doses in the last month.  Preventive Screening-Counseling & Management  Alcohol-Tobacco     Alcohol drinks/day: occassionally     Alcohol type: beer     Smoking Status: quit < 6 months     Smoking Cessation Counseling: yes, stopped 2 weeks ago, 08/31/2009     Packs/Day: 0.5     Year Quit: 2010     Passive Smoke Exposure: yes  Caffeine-Diet-Exercise     Caffeine use/day: sodas     Does Patient Exercise: yes     Type of exercise: walking everywhere     Exercise (avg: min/session): >60     Times/week: 7  Hep-HIV-STD-Contraception     HIV Risk: no risk noted     HIV Risk Counseling: 09/21/2004  Safety-Violence-Falls     Seat Belt Use: yes  Comments: given condoms      Sexual History:  currently monogamous.        Drug Use:  former.     Prior Medication List:  RETROVIR 300 MG TABS (ZIDOVUDINE) Take 1 tablet by mouth two times a day VIREAD 300 MG TABS (TENOFOVIR DISOPROXIL FUMARATE) Take 1  tablet by mouth once a day KALETRA 200-50 MG TABS (LOPINAVIR-RITONAVIR) Take 2 tablets by mouth two times a day ENSURE  LIQD (NUTRITIONAL SUPPLEMENTS) one three times a day ULTRAM 50 MG TABS (TRAMADOL HCL) Take 1 tablet by mouth every 8 hours as needed   Current Allergies (reviewed today): No known allergies  Social History: Sexual History:  currently monogamous Drug Use:  former  Vital Signs:  Patient profile:   60 year old male Height:      71 inches (180.34 cm) Weight:      163.5 pounds (74.32 kg) BMI:     22.89 Temp:     98.2 degrees F (36.78 degrees C) oral Pulse rate:   68 / minute BP sitting:   131 / 78  (left arm) Cuff size:   regular  Vitals Entered By: Jennet Maduro RN (September 14, 2009 3:12 PM) CC: follow-up visit, bones and muscles started "shut down" after Christmas and started back in February, moving much better now Is Patient Diabetic? No Pain Assessment Patient in pain? no      Nutritional Status BMI of 19 -24 = normal Nutritional Status Detail appetite "good"  Have you ever been in a relationship where you felt threatened, hurt or afraid?No   Does patient need assistance?  Functional Status Self care Ambulation Normal Comments when [facility] started he has been able to take his medicine   Physical Exam  General:  alert and overweight-appearing.   Mouth:  pharynx pink and moist, no erythema, no exudates, and poor dentition.   Lungs:  normal breath sounds, no crackles, and no wheezes.   Heart:  normal rate, regular rhythm, and no murmur.   Abdomen:  soft, normal bowel sounds, no masses, and makedlydistended.  I suspect he has developed ascites. Skin:  no rashes.   Psych:  normally interactive, good eye contact, not anxious appearing, and not depressed appearing.  he is very talkative as usual.        Medication Adherence: 09/14/2009   Adherence to medications reviewed with patient. Counseling to provide adequate adherence  provided   Prevention For Positives: 09/14/2009   Safe sex practices discussed with patient. Condoms offered.                             Impression & Recommendations:  Problem # 1:  HIV DISEASE (ICD-042) I will check a full set of lab work today. Diagnostics Reviewed:  HIV: CDC-defined AIDS (05/20/2008)   CD4: 460 (02/18/2009)   WBC: 8.8 (02/17/2009)   Hgb: 10.3 (02/17/2009)   HCT: 32.7 (02/17/2009)   Platelets: 396 (02/17/2009) HIV-1 RNA: <48 copies/mL (02/17/2009)     Orders: Ultrasound (Ultrasound)  Problem # 2:  HEPATITIS C (ICD-070.51) I suspect that he may have cirrhosis complicated by ascites. I will check an abdominal ultrasound today. Orders: Est. Patient Level IV (40981) Ultrasound (Ultrasound)  Problem # 3:  SUBSTANCE ABUSE, MULTIPLE (ICD-305.90) I talked to him about the dangers of polysubstance abuse again today. Orders: Est. Patient Level IV (19147)  Medications Added to Medication List This Visit: 1)  Multivitamins Tabs (Multiple vitamin) .... Take 1 tablet by mouth once a day  Other Orders: T-CD4SP ([facility]) (CD4SP) T-HIV Viral Load [phone]) T-Comprehensive Metabolic Panel [phone]) T-Lipid Profile [phone]) T-CBC w/Diff [phone]) T-RPR (Syphilis) ([id number])  Patient Instructions: 1)  Please schedule a follow-up appointment in 2 weeks.

## 2010-07-08 NOTE — Assessment & Plan Note (Signed)
Summary: F/U/VS   CC:  Seen at [facility] ED last week for left hip pain.  "may need surgery"  Set up with surgeon for 4 weeks.  History of Present Illness: [name] is in for his routine visit.  He recently developed left hip pain while bending over to pick up some scrap iron and was seen in the emergency room.  X-rays showed a subacute fracture of the left femoral neck.  He was seen by Dr. Allie Bossier and told him that he did not want any surgery but is scheduled to see him back in about 3 to 4 weeks.  He says that he lost his prescription for Percocet and has been taking Tylenol at home.  He says he mainly lays on the couch, watches TV and he eats.  He thinks he's missed 3 to 4 doses of his HIV medicines in the past month.  As always the evening dose when he forgets to take them and falls asleep.  He used to use a pill box but no longer has it.  He has not had any problems obtaining his HIV medications.  He continues to smoke a few cigarettes and does not feel like he can stop.  He denies any recent alcohol or drug use and does not feel depressed.  He has not been sexually active.  Preventive Screening-Counseling & Management  Alcohol-Tobacco     Alcohol drinks/day: occassionally     Alcohol type: beer     Smoking Status: current     Smoking Cessation Counseling: yes     Smoke Cessation Stage: contemplative     Packs/Day: <0.25     Year Quit: 2010     Passive Smoke Exposure: yes  Caffeine-Diet-Exercise     Caffeine use/day: sodas     Does Patient Exercise: no, too much pain now     Type of exercise: walking everywhere     Exercise (avg: min/session): >60     Times/week: 7  Comments: can't afford them any more  Hep-HIV-STD-Contraception     HIV Risk: no risk noted     HIV Risk Counseling: not indicated-no HIV risk noted  Safety-Violence-Falls     Seat Belt Use: yes  Comments: given condoms      Sexual History:  currently monogamous.        Drug Use:  former.       Updated Prior Medication List: RETROVIR 300 MG TABS (ZIDOVUDINE) Take 1 tablet by mouth two times a day VIREAD 300 MG TABS (TENOFOVIR DISOPROXIL FUMARATE) Take 1 tablet by mouth once a day KALETRA 200-50 MG TABS (LOPINAVIR-RITONAVIR) Take 2 tablets by mouth two times a day  Current Allergies (reviewed today): No known allergies  Vital Signs:  Patient profile:   59 year old male Height:      71 inches (180.34 cm) Weight:      151.5 pounds (68.86 kg) BMI:     21.21 Temp:     98.4 degrees F (36.89 degrees C) oral Pulse rate:   74 / minute BP sitting:   108 / 73  (left arm) Cuff size:   large  Vitals Entered By: Jennet Maduro RN (June 15, 2010 9:30 AM) CC: Seen at [facility] ED last week for left hip pain.  "may need surgery"  Set up with surgeon for 4 weeks Is Patient Diabetic? No Pain Assessment Patient in pain? yes     Location: left hip Intensity: 10 Type: sharp Onset of pain  Constant Nutritional Status  BMI of 19 -24 = normal Nutritional Status Detail appetite "pretty good"  Have you ever been in a relationship where you felt threatened, hurt or afraid?No   Does patient need assistance? Functional Status Self care Ambulation Impaired:Risk for fall Comments using crutch for support.  May have missed a couple of doses of rxes.  Given AM/PM weekly pill box.   Physical Exam  General:  alert and underweight appearing.  he has approximately 5 layers of clothing on. Mouth:  pharynx pink and moist, no erythema, no exudates, and poor dentition.   Lungs:  normal breath sounds, no crackles, and no wheezes.   Heart:  normal rate, regular rhythm, and no murmur.   Neurologic:  alert & oriented X3.  he walks slowly with the aid of a crutch. Axillary Nodes:  no R axillary adenopathy and no L axillary adenopathy.   Psych:  normally interactive, good eye contact, not anxious appearing, and not depressed appearing.          Medication Adherence: 06/15/2010   Adherence to  medications reviewed with patient. Counseling to provide adequate adherence provided   Prevention For Positives: 06/15/2010   Safe sex practices discussed with patient. Condoms offered.         06/15/2010   Patient was screened for substance abuse and depression. Referal was made as indicated.                      Impression & Recommendations:  Problem # 1:  HIV DISEASE (ICD-042) His viral load remains undetectable but I'm worried about emerging resistance if he continues to miss his many doses each month as he has been.  He was given a pill box today and I will see if our case managers can work with him on medication adherence. Diagnostics Reviewed:  HIV: CDC-defined AIDS (05/20/2008)   CD4: 450 (06/03/2010)   WBC: 7.6 (01/13/2010)   Hgb: 12.2 (01/13/2010)   HCT: 38.6 (01/13/2010)   Platelets: 349 (01/13/2010) HIV-1 RNA: <20 copies/mL (06/02/2010)     Orders: Misc. Referral (Misc. Ref)  Problem # 2:  HIP FRACTURE, LEFT (ICD-820.8) I wonder if he may have avascular necrosis contributing to his hip fracture.  I have encouraged him to consider surgery. Orders: Est. Patient Level IV (16109)  Problem # 3:  CIGARETTE SMOKER (ICD-305.1) I have encouraged him to quit smoking completely.  He is currently not willing to work with the counselor on setting up a plan. Orders: Est. 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  He may need referral to a general surgeon.  Other Orders: Future Orders: T-CD4SP ([facility]) (CD4SP) ... 09/13/2010 T-HIV Viral Load [phone]) ... 09/13/2010 T-Comprehensive Metabolic Panel [phone]) ... 09/13/2010 T-CBC w/Diff ([id number]) ... 09/13/2010 T-RPR (Syphilis) [phone]) ... 09/13/2010 T-Lipid Profile [phone]) ... 09/13/2010  Patient Instructions: 1)  Please schedule a follow-up appointment in 3 months.

## 2010-07-19 ENCOUNTER — Telehealth (INDEPENDENT_AMBULATORY_CARE_PROVIDER_SITE_OTHER): Payer: Self-pay | Admitting: *Deleted

## 2010-07-28 NOTE — Progress Notes (Signed)
Summary: Pt. requesting placement in Drug Rehab.  Phone Note Call from Patient Call back at [phone]   Caller: Patient Call For: Cliffton Asters MD Reason for Call: Acute Illness Summary of Call: Pt. using drugs daily.  Requestiing to be placed in drug rehab.  Pt. given tel. # for [facility], [facility] Crisis [facility], [phone].  Pt. called back because he wrote the number down wrong.  Jennet Maduro RN  July 19, 2010 9:12 AM

## 2010-08-04 ENCOUNTER — Telehealth: Payer: Self-pay | Admitting: Internal Medicine

## 2010-08-12 NOTE — Progress Notes (Signed)
Summary: Pt. requesting Ensure rx restarted  Phone Note Call from Patient Call back at Home Phone [phone]   Caller: Patient Call For: Cliffton Asters MD Reason for Call: Refill Medication Summary of Call: Pt. would like to be put  back on Ensure.  Will need an rx to be sent to [facility] if refilling.  Please advise.  Jennet Maduro RN  August 04, 2010 5:01 PM   Follow-up for Phone Call        Please prescribe Ensure 1 can by mouth three times a day  Follow-up by: Cliffton Asters MD,  August 05, 2010 10:32 AM    New/Updated Medications: ENSURE  LIQD (NUTRITIONAL SUPPLEMENTS) 1can by mouth three times a day

## 2010-08-20 LAB — T-HELPER CELL (CD4) - (RCID CLINIC ONLY): CD4 T Cell Abs: 640 uL (ref 400–2700)

## 2010-08-25 LAB — T-HELPER CELL (CD4) - (RCID CLINIC ONLY): CD4 % Helper T Cell: 11 % — ABNORMAL LOW (ref 33–55)

## 2010-08-26 LAB — DIFFERENTIAL
Basophils Absolute: 0.1 10*3/uL (ref 0.0–0.1)
Basophils Relative: 1 % (ref 0–1)
Eosinophils Absolute: 0 K/uL (ref 0.0–0.7)
Eosinophils Relative: 1 % (ref 0–5)
Lymphocytes Relative: 28 % (ref 12–46)
Lymphs Abs: 1.8 K/uL (ref 0.7–4.0)
Monocytes Absolute: 0.7 10*3/uL (ref 0.1–1.0)
Monocytes Relative: 10 % (ref 3–12)
Neutro Abs: 3.9 10*3/uL (ref 1.7–7.7)
Neutrophils Relative %: 60 % (ref 43–77)

## 2010-08-26 LAB — CBC
HCT: 37.7 % — ABNORMAL LOW (ref 39.0–52.0)
Hemoglobin: 12.6 g/dL — ABNORMAL LOW (ref 13.0–17.0)
MCHC: 33.5 g/dL (ref 30.0–36.0)
MCV: 106.4 fL — ABNORMAL HIGH (ref 78.0–100.0)
Platelets: 203 10*3/uL (ref 150–400)
RBC: 3.54 MIL/uL — ABNORMAL LOW (ref 4.22–5.81)
RDW: 13.9 % (ref 11.5–15.5)
WBC: 6.5 10*3/uL (ref 4.0–10.5)

## 2010-08-26 LAB — COMPREHENSIVE METABOLIC PANEL WITH GFR
ALT: 48 U/L (ref 0–53)
AST: 56 U/L — ABNORMAL HIGH (ref 0–37)
Albumin: 2.9 g/dL — ABNORMAL LOW (ref 3.5–5.2)
Calcium: 8.3 mg/dL — ABNORMAL LOW (ref 8.4–10.5)
GFR calc Af Amer: >60 mL/min (ref >60)
Sodium: 138 meq/L (ref 135–145)
Total Protein: 6.6 g/dL (ref 6.0–8.3)

## 2010-08-26 LAB — COMPREHENSIVE METABOLIC PANEL
Alkaline Phosphatase: 100 U/L (ref 39–117)
BUN: 15 mg/dL (ref 6–23)
CO2: 26 mEq/L (ref 19–32)
Chloride: 108 mEq/L (ref 96–112)
Creatinine, Ser: 0.69 mg/dL (ref 0.4–1.5)
GFR calc non Af Amer: >60 mL/min (ref >60)
Glucose, Bld: 119 mg/dL — ABNORMAL HIGH (ref 70–99)
Potassium: 4.2 mEq/L (ref 3.5–5.1)
Total Bilirubin: 0.3 mg/dL (ref 0.3–1.2)

## 2010-08-30 LAB — COMPREHENSIVE METABOLIC PANEL
ALT: 45 U/L (ref 0–53)
AST: 71 U/L — ABNORMAL HIGH (ref 0–37)
Albumin: 3.2 g/dL — ABNORMAL LOW (ref 3.5–5.2)
Alkaline Phosphatase: 88 U/L (ref 39–117)
CO2: 21 mEq/L (ref 19–32)
Chloride: 114 mEq/L — ABNORMAL HIGH (ref 96–112)
GFR calc Af Amer: >60 mL/min (ref >60)
GFR calc non Af Amer: >60 mL/min (ref >60)
Potassium: 4.2 mEq/L (ref 3.5–5.1)
Sodium: 140 mEq/L (ref 135–145)
Total Bilirubin: 0.9 mg/dL (ref 0.3–1.2)

## 2010-08-30 LAB — RAPID URINE DRUG SCREEN, HOSP PERFORMED
Amphetamines: NOT DETECTED
Barbiturates: NOT DETECTED
Benzodiazepines: NOT DETECTED

## 2010-08-30 LAB — GLUCOSE, CAPILLARY: Glucose-Capillary: 193 mg/dL — ABNORMAL HIGH (ref 70–99)

## 2010-08-30 LAB — DIFFERENTIAL
Basophils Absolute: 0.2 10*3/uL — ABNORMAL HIGH (ref 0.0–0.1)
Lymphs Abs: 4.8 10*3/uL — ABNORMAL HIGH (ref 0.7–4.0)
Monocytes Absolute: 1.1 10*3/uL — ABNORMAL HIGH (ref 0.1–1.0)
Neutro Abs: 3.6 10*3/uL (ref 1.7–7.7)

## 2010-08-30 LAB — CBC
MCV: 103.3 fL — ABNORMAL HIGH (ref 78.0–100.0)
RBC: 4.07 MIL/uL — ABNORMAL LOW (ref 4.22–5.81)
WBC: 10 10*3/uL (ref 4.0–10.5)

## 2010-08-30 LAB — URINALYSIS, ROUTINE W REFLEX MICROSCOPIC
Glucose, UA: NEGATIVE mg/dL
Leukocytes, UA: NEGATIVE
Protein, ur: 30 mg/dL — AB
Specific Gravity, Urine: 1.023 (ref 1.005–1.030)
Urobilinogen, UA: 0.2 mg/dL (ref 0.0–1.0)

## 2010-08-30 LAB — ETHANOL: Alcohol, Ethyl (B): <5 mg/dL (ref 0–10)

## 2010-08-30 LAB — URINE MICROSCOPIC-ADD ON

## 2010-09-08 ENCOUNTER — Ambulatory Visit: Payer: Self-pay | Admitting: Internal Medicine

## 2010-09-10 LAB — T-HELPER CELL (CD4) - (RCID CLINIC ONLY)
CD4 % Helper T Cell: 9 % — ABNORMAL LOW (ref 33–55)
CD4 T Cell Abs: 460 uL (ref 400–2700)

## 2010-09-13 LAB — T-HELPER CELL (CD4) - (RCID CLINIC ONLY): CD4 T Cell Abs: 330 uL — ABNORMAL LOW (ref 400–2700)

## 2010-09-15 ENCOUNTER — Other Ambulatory Visit: Payer: Self-pay

## 2010-09-15 LAB — CBC
HCT: 31 % — ABNORMAL LOW (ref 39.0–52.0)
HCT: 35.4 % — ABNORMAL LOW (ref 39.0–52.0)
Hemoglobin: 11 g/dL — ABNORMAL LOW (ref 13.0–17.0)
MCV: 104.6 fL — ABNORMAL HIGH (ref 78.0–100.0)
MCV: 104.9 fL — ABNORMAL HIGH (ref 78.0–100.0)
Platelets: 165 10*3/uL (ref 150–400)
Platelets: 175 10*3/uL (ref 150–400)
Platelets: 232 10*3/uL (ref 150–400)
RBC: 2.92 MIL/uL — ABNORMAL LOW (ref 4.22–5.81)
RBC: 2.93 MIL/uL — ABNORMAL LOW (ref 4.22–5.81)
RBC: 3.38 MIL/uL — ABNORMAL LOW (ref 4.22–5.81)
WBC: 10.1 10*3/uL (ref 4.0–10.5)
WBC: 5.7 10*3/uL (ref 4.0–10.5)
WBC: 7.2 10*3/uL (ref 4.0–10.5)

## 2010-09-15 LAB — TYPE AND SCREEN: Antibody Screen: NEGATIVE

## 2010-09-15 LAB — POCT CARDIAC MARKERS
CKMB, poc: 1.6 ng/mL (ref 1.0–8.0)
Myoglobin, poc: 48.2 ng/mL (ref 12–200)
Troponin i, poc: <0.05 ng/mL (ref 0.00–0.09)

## 2010-09-15 LAB — AFB CULTURE WITH SMEAR (NOT AT ARMC)
Acid Fast Smear: NONE SEEN
Acid Fast Smear: NONE SEEN

## 2010-09-15 LAB — EXPECTORATED SPUTUM ASSESSMENT W GRAM STAIN, RFLX TO RESP C

## 2010-09-15 LAB — HIV-1 RNA ULTRAQUANT REFLEX TO GENTYP+
HIV 1 RNA Quant: <48 copies/mL (ref <48)
HIV-1 RNA Quant, Log: <1.68 {Log} (ref <1.68)

## 2010-09-15 LAB — T-HELPER CELLS (CD4) COUNT (NOT AT ARMC): CD4 T Cell Abs: 380 uL — ABNORMAL LOW (ref 400–2700)

## 2010-09-15 LAB — CULTURE, RESPIRATORY W GRAM STAIN

## 2010-09-15 LAB — ABO/RH: ABO/RH(D): O POS

## 2010-09-15 LAB — MISCELLANEOUS TEST

## 2010-09-16 ENCOUNTER — Other Ambulatory Visit (INDEPENDENT_AMBULATORY_CARE_PROVIDER_SITE_OTHER): Payer: Medicaid Other

## 2010-09-16 DIAGNOSIS — Z113 Encounter for screening for infections with a predominantly sexual mode of transmission: Secondary | ICD-10-CM

## 2010-09-16 DIAGNOSIS — Z79899 Other long term (current) drug therapy: Secondary | ICD-10-CM

## 2010-09-16 DIAGNOSIS — B20 Human immunodeficiency virus [HIV] disease: Secondary | ICD-10-CM

## 2010-09-16 LAB — CBC WITH DIFFERENTIAL/PLATELET
Basophils Absolute: 0 10*3/uL (ref 0.0–0.1)
Basophils Relative: 0 % (ref 0–1)
Hemoglobin: 12.6 g/dL — ABNORMAL LOW (ref 13.0–17.0)
Lymphocytes Relative: 53 % — ABNORMAL HIGH (ref 12–46)
MCHC: 32.4 g/dL (ref 30.0–36.0)
Monocytes Relative: 8 % (ref 3–12)
Neutro Abs: 4 10*3/uL (ref 1.7–7.7)
Neutrophils Relative %: 37 % — ABNORMAL LOW (ref 43–77)
RDW: 16 % — ABNORMAL HIGH (ref 11.5–15.5)
WBC: 10.6 10*3/uL — ABNORMAL HIGH (ref 4.0–10.5)

## 2010-09-16 LAB — COMPLETE METABOLIC PANEL WITH GFR
ALT: 58 U/L — ABNORMAL HIGH (ref 0–53)
AST: 45 U/L — ABNORMAL HIGH (ref 0–37)
Alkaline Phosphatase: 147 U/L — ABNORMAL HIGH (ref 39–117)
Creat: 1 mg/dL (ref 0.40–1.50)
Sodium: 142 mEq/L (ref 135–145)
Total Bilirubin: 0.3 mg/dL (ref 0.3–1.2)
Total Protein: 6.6 g/dL (ref 6.0–8.3)

## 2010-09-16 LAB — RPR

## 2010-09-16 LAB — LIPID PANEL
Cholesterol: 133 mg/dL (ref 0–200)
Triglycerides: 104 mg/dL (ref <150)

## 2010-09-17 LAB — T-HELPER CELL (CD4) - (RCID CLINIC ONLY): CD4 % Helper T Cell: 13 % — ABNORMAL LOW (ref 33–55)

## 2010-09-18 LAB — HIV-1 RNA QUANT-NO REFLEX-BLD
HIV 1 RNA Quant: <20 copies/mL (ref <20)
HIV-1 RNA Quant, Log: <1.3 {Log} (ref <1.30)

## 2010-09-22 ENCOUNTER — Ambulatory Visit: Payer: Self-pay | Admitting: Internal Medicine

## 2010-09-29 ENCOUNTER — Ambulatory Visit (INDEPENDENT_AMBULATORY_CARE_PROVIDER_SITE_OTHER): Payer: Medicaid Other | Admitting: Internal Medicine

## 2010-09-29 ENCOUNTER — Encounter: Payer: Self-pay | Admitting: Internal Medicine

## 2010-09-29 VITALS — BP 127/85 | HR 85 | Temp 99.3°F | Ht 71.0 in | Wt 152.5 lb

## 2010-09-29 DIAGNOSIS — S72009A Fracture of unspecified part of neck of unspecified femur, initial encounter for closed fracture: Secondary | ICD-10-CM

## 2010-09-29 DIAGNOSIS — F528 Other sexual dysfunction not due to a substance or known physiological condition: Secondary | ICD-10-CM

## 2010-09-29 DIAGNOSIS — B20 Human immunodeficiency virus [HIV] disease: Secondary | ICD-10-CM

## 2010-09-29 MED ORDER — ENSURE PO LIQD: 237.0000 mL | Freq: Two times a day (BID) | ORAL | Status: DC

## 2010-09-29 NOTE — Assessment & Plan Note (Signed)
Despite ongoing problems with adherence his VL is still undetectable. I gave him another pillbox.

## 2010-09-29 NOTE — Assessment & Plan Note (Signed)
I will check his testosterone level 

## 2010-09-29 NOTE — Assessment & Plan Note (Signed)
He will make a f/u appt with Dr. Magnus Ivan.

## 2010-09-29 NOTE — Progress Notes (Signed)
  Subjective:    Patient ID: [name], male    DOB: Aug 10, 1950, 60 y.o.   MRN: [id number]  HPIRemous is in for his routine visit. He misses about 2 doses of his HIV meds each week when he forgets. Not using a pillbox after losing it. Not sexually active due to ED. Denies recent drug and EtOH use.  He is having more left hip pain and wants to talk about surgery again with Dr. Magnus Callahan.    Review of Systems     Objective:   Physical Exam  Constitutional: No distress.       thin  HENT:  Mouth/Throat: Oropharynx is clear and moist. No oropharyngeal exudate.  Cardiovascular: Normal rate, regular rhythm and normal heart sounds.   No murmur heard. Pulmonary/Chest: Breath sounds normal. He has no wheezes. He has no rales.  Psychiatric: He has a normal mood and affect.          Assessment & Plan:

## 2010-09-30 LAB — TESTOSTERONE, FREE, TOTAL, SHBG
Testosterone, Free: 15.9 pg/mL — ABNORMAL LOW (ref 47.0–244.0)
Testosterone-% Free: 0.7 % — ABNORMAL LOW (ref 1.6–2.9)
Testosterone: 230.64 ng/dL — ABNORMAL LOW (ref 250–890)
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
## 2010-11-15 ENCOUNTER — Telehealth: Payer: Self-pay | Admitting: *Deleted

## 2010-11-15 NOTE — Telephone Encounter (Signed)
Attempted to return pt's phone call.  Phone number which was left is non-working and the # in [facility] is not in service.  Jennet Maduro, RN.

## 2011-01-05 ENCOUNTER — Other Ambulatory Visit: Payer: Medicaid Other

## 2011-01-19 ENCOUNTER — Ambulatory Visit: Payer: Medicaid Other | Admitting: Internal Medicine

## 2011-02-01 ENCOUNTER — Telehealth: Payer: Self-pay | Admitting: *Deleted

## 2011-02-01 NOTE — Telephone Encounter (Signed)
He left message for [place] to call him but did not leave a phone number. The one in the chart has been disconnected. Will  Await his call back

## 2011-02-10 ENCOUNTER — Other Ambulatory Visit: Payer: Self-pay | Admitting: Licensed Clinical Social Worker

## 2011-02-10 DIAGNOSIS — B20 Human immunodeficiency virus [HIV] disease: Secondary | ICD-10-CM

## 2011-02-10 MED ORDER — LOPINAVIR-RITONAVIR 200-50 MG PO TABS: 2.0000 | ORAL_TABLET | Freq: Two times a day (BID) | ORAL | Status: DC

## 2011-02-10 MED ORDER — TENOFOVIR DISOPROXIL FUMARATE 300 MG PO TABS: 300.0000 mg | ORAL_TABLET | Freq: Every day | ORAL | Status: DC

## 2011-02-10 MED ORDER — ZIDOVUDINE 300 MG PO TABS: 300.0000 mg | ORAL_TABLET | Freq: Two times a day (BID) | ORAL | Status: DC

## 2011-02-25 LAB — T-HELPER CELL (CD4) - (RCID CLINIC ONLY)
CD4 % Helper T Cell: 14 — ABNORMAL LOW
CD4 T Cell Abs: 340 — ABNORMAL LOW

## 2011-03-03 LAB — T-HELPER CELL (CD4) - (RCID CLINIC ONLY)
CD4 % Helper T Cell: 10 — ABNORMAL LOW
CD4 T Cell Abs: 220 — ABNORMAL LOW

## 2011-04-18 ENCOUNTER — Other Ambulatory Visit (INDEPENDENT_AMBULATORY_CARE_PROVIDER_SITE_OTHER): Payer: Medicaid Other

## 2011-04-18 ENCOUNTER — Other Ambulatory Visit: Payer: Self-pay | Admitting: Internal Medicine

## 2011-04-18 DIAGNOSIS — B20 Human immunodeficiency virus [HIV] disease: Secondary | ICD-10-CM

## 2011-04-20 LAB — HIV-1 RNA QUANT-NO REFLEX-BLD: HIV-1 RNA Quant, Log: 1.46 {Log} — ABNORMAL HIGH (ref <1.30)

## 2011-05-02 ENCOUNTER — Ambulatory Visit: Payer: Medicaid Other | Admitting: Internal Medicine

## 2011-07-12 ENCOUNTER — Ambulatory Visit: Payer: Medicaid Other | Admitting: Internal Medicine

## 2011-07-13 ENCOUNTER — Encounter: Payer: Self-pay | Admitting: *Deleted

## 2011-08-04 ENCOUNTER — Other Ambulatory Visit: Payer: Self-pay | Admitting: Licensed Clinical Social Worker

## 2011-08-04 DIAGNOSIS — B20 Human immunodeficiency virus [HIV] disease: Secondary | ICD-10-CM

## 2011-08-04 MED ORDER — ENSURE PO LIQD: 237.0000 mL | Freq: Two times a day (BID) | ORAL | Status: DC

## 2011-08-18 ENCOUNTER — Telehealth: Payer: Self-pay | Admitting: *Deleted

## 2011-08-18 ENCOUNTER — Encounter: Payer: Self-pay | Admitting: *Deleted

## 2011-08-18 ENCOUNTER — Ambulatory Visit: Payer: Medicaid Other | Admitting: Internal Medicine

## 2011-08-18 NOTE — Telephone Encounter (Signed)
Phone number out-of-service.  Will send pt a letter.

## 2011-11-10 ENCOUNTER — Telehealth: Payer: Self-pay | Admitting: *Deleted

## 2011-11-10 ENCOUNTER — Encounter: Payer: Self-pay | Admitting: *Deleted

## 2011-11-10 NOTE — Telephone Encounter (Signed)
Unable to reach byy phone. Letter sent & referral made to bridge counsellor

## 2012-01-02 ENCOUNTER — Other Ambulatory Visit: Payer: Self-pay | Admitting: Licensed Clinical Social Worker

## 2012-01-02 DIAGNOSIS — B20 Human immunodeficiency virus [HIV] disease: Secondary | ICD-10-CM

## 2012-01-02 MED ORDER — LOPINAVIR-RITONAVIR 200-50 MG PO TABS: 2.0000 | ORAL_TABLET | Freq: Two times a day (BID) | ORAL | Status: DC

## 2012-01-02 MED ORDER — ZIDOVUDINE 300 MG PO TABS: 300.0000 mg | ORAL_TABLET | Freq: Two times a day (BID) | ORAL | Status: DC

## 2012-01-02 MED ORDER — TENOFOVIR DISOPROXIL FUMARATE 300 MG PO TABS: 300.0000 mg | ORAL_TABLET | Freq: Every day | ORAL | Status: DC

## 2012-01-12 ENCOUNTER — Other Ambulatory Visit: Payer: Medicaid Other

## 2012-01-25 ENCOUNTER — Ambulatory Visit: Payer: Medicaid Other | Admitting: Internal Medicine

## 2012-02-20 ENCOUNTER — Other Ambulatory Visit: Payer: Self-pay | Admitting: Licensed Clinical Social Worker

## 2012-02-20 DIAGNOSIS — B20 Human immunodeficiency virus [HIV] disease: Secondary | ICD-10-CM

## 2012-02-20 MED ORDER — ENSURE PO LIQD: 237.0000 mL | Freq: Two times a day (BID) | ORAL | Status: DC

## 2012-03-05 ENCOUNTER — Other Ambulatory Visit: Payer: Medicaid Other

## 2012-03-06 ENCOUNTER — Other Ambulatory Visit: Payer: Medicaid Other

## 2012-03-12 ENCOUNTER — Other Ambulatory Visit: Payer: Self-pay | Admitting: Infectious Diseases

## 2012-03-20 ENCOUNTER — Ambulatory Visit: Payer: Medicaid Other | Admitting: Internal Medicine

## 2012-04-11 ENCOUNTER — Other Ambulatory Visit: Payer: Self-pay | Admitting: Infectious Diseases

## 2012-04-11 DIAGNOSIS — B20 Human immunodeficiency virus [HIV] disease: Secondary | ICD-10-CM

## 2012-05-11 ENCOUNTER — Telehealth: Payer: Self-pay | Admitting: *Deleted

## 2012-05-11 NOTE — Telephone Encounter (Signed)
[facility] called and requested this patient medication list. Advised them he has not been seen in this office since 09/29/10 and they still wanted his med list as they got refill request. Advised them to have the patient call the office as we have not seen him since 04/18/11.

## 2012-06-04 ENCOUNTER — Other Ambulatory Visit: Payer: Medicaid Other

## 2012-06-05 ENCOUNTER — Other Ambulatory Visit (HOSPITAL_COMMUNITY): Payer: Self-pay | Admitting: Orthopaedic Surgery

## 2012-06-18 ENCOUNTER — Ambulatory Visit (INDEPENDENT_AMBULATORY_CARE_PROVIDER_SITE_OTHER): Payer: Medicaid Other | Admitting: Internal Medicine

## 2012-06-18 ENCOUNTER — Encounter: Payer: Self-pay | Admitting: Internal Medicine

## 2012-06-18 VITALS — BP 123/77 | HR 62 | Temp 98.0°F | Ht 71.0 in | Wt 150.2 lb

## 2012-06-18 DIAGNOSIS — Z21 Asymptomatic human immunodeficiency virus [HIV] infection status: Secondary | ICD-10-CM

## 2012-06-18 DIAGNOSIS — Z23 Encounter for immunization: Secondary | ICD-10-CM

## 2012-06-18 DIAGNOSIS — J42 Unspecified chronic bronchitis: Secondary | ICD-10-CM | POA: Insufficient documentation

## 2012-06-18 DIAGNOSIS — S72009A Fracture of unspecified part of neck of unspecified femur, initial encounter for closed fracture: Secondary | ICD-10-CM

## 2012-06-18 DIAGNOSIS — X58XXXA Exposure to other specified factors, initial encounter: Secondary | ICD-10-CM

## 2012-06-18 MED ORDER — HYDROCODONE-ACETAMINOPHEN 7.5-500 MG PO TABS: 1.0000 | ORAL_TABLET | Freq: Four times a day (QID) | ORAL | Status: DC | PRN
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
## 2012-06-18 NOTE — Patient Instructions (Addendum)
[name]: [phone]

## 2012-06-21 ENCOUNTER — Other Ambulatory Visit (HOSPITAL_COMMUNITY): Payer: Self-pay | Admitting: Orthopaedic Surgery

## 2012-07-13 ENCOUNTER — Encounter (HOSPITAL_COMMUNITY): Payer: Self-pay

## 2012-07-13 ENCOUNTER — Ambulatory Visit (HOSPITAL_COMMUNITY)
Admission: RE | Admit: 2012-07-13 | Discharge: 2012-07-13 | Disposition: A | Discharge status: Home / Self Care | Payer: Medicaid Other | Source: Ambulatory Visit | Attending: Orthopaedic Surgery | Admitting: Orthopaedic Surgery

## 2012-07-13 ENCOUNTER — Encounter (HOSPITAL_COMMUNITY): Payer: Self-pay | Admitting: Pharmacy Technician

## 2012-07-13 ENCOUNTER — Other Ambulatory Visit (HOSPITAL_COMMUNITY): Payer: Self-pay | Admitting: Orthopaedic Surgery

## 2012-07-13 ENCOUNTER — Encounter (HOSPITAL_COMMUNITY)
Admission: RE | Admit: 2012-07-13 | Discharge: 2012-07-13 | Disposition: A | Discharge status: Home / Self Care | Payer: Medicaid Other | Source: Ambulatory Visit | Attending: Orthopaedic Surgery | Admitting: Orthopaedic Surgery

## 2012-07-13 DIAGNOSIS — F17200 Nicotine dependence, unspecified, uncomplicated: Secondary | ICD-10-CM | POA: Insufficient documentation

## 2012-07-13 DIAGNOSIS — Z01810 Encounter for preprocedural cardiovascular examination: Secondary | ICD-10-CM | POA: Insufficient documentation

## 2012-07-13 DIAGNOSIS — I440 Atrioventricular block, first degree: Secondary | ICD-10-CM | POA: Insufficient documentation

## 2012-07-13 DIAGNOSIS — Z21 Asymptomatic human immunodeficiency virus [HIV] infection status: Secondary | ICD-10-CM | POA: Insufficient documentation

## 2012-07-13 DIAGNOSIS — Z01812 Encounter for preprocedural laboratory examination: Secondary | ICD-10-CM | POA: Insufficient documentation

## 2012-07-13 HISTORY — DX: Human immunodeficiency virus (HIV) disease: B20

## 2012-07-13 HISTORY — DX: Respiratory tuberculosis unspecified: A15.9

## 2012-07-13 HISTORY — DX: Pneumonia, unspecified organism: J18.9

## 2012-07-13 HISTORY — DX: Anogenital (venereal) warts: A63.0

## 2012-07-13 HISTORY — DX: Inflammatory liver disease, unspecified: K75.9

## 2012-07-13 HISTORY — DX: Unspecified osteoarthritis, unspecified site: M19.90

## 2012-07-13 HISTORY — DX: Unspecified chronic bronchitis: J42

## 2012-07-13 HISTORY — DX: Other psychoactive substance abuse, uncomplicated: F19.10

## 2012-07-13 LAB — TYPE AND SCREEN: Antibody Screen: NEGATIVE

## 2012-07-13 LAB — CBC
HCT: 41 % (ref 39.0–52.0)
Hemoglobin: 13.6 g/dL (ref 13.0–17.0)
MCH: 33.3 pg (ref 26.0–34.0)
MCV: 100.5 fL — ABNORMAL HIGH (ref 78.0–100.0)
Platelets: 182 10*3/uL (ref 150–400)
RBC: 4.08 MIL/uL — ABNORMAL LOW (ref 4.22–5.81)
WBC: 7.7 10*3/uL (ref 4.0–10.5)

## 2012-07-13 LAB — SURGICAL PCR SCREEN: MRSA, PCR: NEGATIVE

## 2012-07-13 LAB — URINALYSIS, ROUTINE W REFLEX MICROSCOPIC
Bilirubin Urine: NEGATIVE
Glucose, UA: NEGATIVE mg/dL
Nitrite: NEGATIVE
Specific Gravity, Urine: 1.009 (ref 1.005–1.030)
pH: 5.5 (ref 5.0–8.0)

## 2012-07-13 LAB — PROTIME-INR
INR: 1 (ref 0.00–1.49)
Prothrombin Time: 13.1 seconds (ref 11.6–15.2)

## 2012-07-13 LAB — COMPREHENSIVE METABOLIC PANEL
ALT: 115 U/L — ABNORMAL HIGH (ref 0–53)
BUN: 10 mg/dL (ref 6–23)
CO2: 22 mEq/L (ref 19–32)
Chloride: 98 mEq/L (ref 96–112)
GFR calc Af Amer: >90 mL/min (ref >90)
Potassium: 5.8 mEq/L — ABNORMAL HIGH (ref 3.5–5.1)
Total Protein: 8.2 g/dL (ref 6.0–8.3)

## 2012-07-13 NOTE — Progress Notes (Signed)
LOV  Dr [name], infectious disease 06/18/12 EPIC.  AT PST visit, admitted to [facility] COCAINE USAGE TWICE MONTH- last dose 07/12/12.  INSTRUCTED HIM AND SISTER, BRENDA CLAY, NO MORE CRACK COCAINE- ANESTHESIA AND COCAINE DO NOT MIX AND INFORMED HIM HE WOULD DIE!!!Instructed him he will not come out of the OPERATING ROOM.  Verbalized understanding. Notified Dr Marjo Bicker , anesthesia as well- order for STAT URINE DRUG SCREEN MORNING OF SURGERY GIVEN BY DR [facility].  Faxed abnormal CMET to Dr Eliberto Ivory office- Cordelia Pen at [phone] with confirmation and called her to notify her of fax to be arriving for MD review and also of crack usage last night.   Notified sister, Steward Drone of pos PCR- staph- states will get Mupirocin filled today and give to her brother, instructions reviewed with her

## 2012-07-13 NOTE — Patient Instructions (Addendum)
__                        20 [name]  07/13/2012 Y  Report to [facility] at   0720    AM.  Call this number if you have problems the morning of surgery: [phone]      Your procedure is scheduled on:  07/20/12 FRIDA   Remember:   Do not eat food  Or drink :After Midnight  Thursday NIGHT                                           TAKE THESE MEDICINES WITH SIP WATER Friday [name], RETROVIR   May take Lortab if needed.  NO MORE CRACK COCAINE  .  Contacts, dentures or partial plates can not be worn to surgery  Leave suitcase in the car. After surgery it may be brought to your room.  For patients admitted to the hospital, checkout time is 11:00 AM day of  discharge.             SPECIAL INSTRUCTIONS- SEE [facility] PREPARING FOR SURGERY INSTRUCTION SHEET-     DO NOT WEAR JEWELRY, LOTIONS, POWDERS, OR PERFUMES.  WOMEN-- DO NOT SHAVE LEGS OR UNDERARMS FOR 12 HOURS BEFORE SHOWERS. MEN MAY SHAVE FACE.  Patients discharged the day of surgery will not be allowed to drive home. IF going home the day of surgery, you must have a driver and someone to stay with you for the first 24 hours  Name and phone number of your driver:        Sister or girlfriend                                                                Please read over the following fact sheets that you were given: MRSA Information, Incentive Spirometry Sheet, Blood Transfusion Sheet  Information                                                                                   [name]  PST 336  [id number]                 FAILURE TO FOLLOW THESE INSTRUCTIONS MAY RESULT IN  CANCELLATION   OF YOUR SURGERY                                                  Patient Signature _____________________________                        [name] RN  CALLED AND LEFT MESSAGE WITH [name] PT SISTER NEW  SURGERY DATE 08-10-2012 NPO AFTER MIDNIGHT ARRIVE 0930 AM [facility] SHORTSTAY. FOLLOW ALL OTHER PRE OP  INSTRUCTIONS GIVEN BY NURSE AT PRE OP VISIT.  [name] CALLED BACK - STATES SHE RECEIVED [name]'S MESSAGE-VOICED UNDERSTANDING OF INSTRUCTIONS.

## 2012-07-20 ENCOUNTER — Ambulatory Visit (HOSPITAL_COMMUNITY): Admission: RE | Admit: 2012-07-20 | Payer: Medicaid Other | Source: Ambulatory Visit | Admitting: Orthopaedic Surgery

## 2012-07-20 ENCOUNTER — Encounter (HOSPITAL_COMMUNITY): Admission: RE | Payer: Self-pay | Source: Ambulatory Visit

## 2012-07-20 DIAGNOSIS — M87059 Idiopathic aseptic necrosis of unspecified femur: Secondary | ICD-10-CM

## 2012-07-20 SURGERY — ARTHROPLASTY, HIP, TOTAL, ANTERIOR APPROACH
Anesthesia: General | Site: Hip | Laterality: Left
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
## 2012-07-24 ENCOUNTER — Other Ambulatory Visit: Payer: Medicaid Other

## 2012-07-24 NOTE — Progress Notes (Addendum)
CBC, CMET, UA, PT, PTT, SURG SCREEN, CHEST 2 VIEW XRAY AND EKG  07-13-2012 EPIC

## 2012-07-28 ENCOUNTER — Encounter (HOSPITAL_COMMUNITY): Payer: Self-pay

## 2012-07-28 ENCOUNTER — Emergency Department (HOSPITAL_COMMUNITY)
Admission: EM | Admit: 2012-07-28 | Discharge: 2012-07-28 | Disposition: A | Discharge status: Home / Self Care | Payer: Medicaid Other | Attending: Emergency Medicine | Admitting: Emergency Medicine

## 2012-07-28 DIAGNOSIS — Z8619 Personal history of other infectious and parasitic diseases: Secondary | ICD-10-CM | POA: Insufficient documentation

## 2012-07-28 DIAGNOSIS — Z21 Asymptomatic human immunodeficiency virus [HIV] infection status: Secondary | ICD-10-CM | POA: Insufficient documentation

## 2012-07-28 DIAGNOSIS — F17200 Nicotine dependence, unspecified, uncomplicated: Secondary | ICD-10-CM | POA: Insufficient documentation

## 2012-07-28 DIAGNOSIS — Z008 Encounter for other general examination: Secondary | ICD-10-CM | POA: Insufficient documentation

## 2012-07-28 DIAGNOSIS — F1010 Alcohol abuse, uncomplicated: Secondary | ICD-10-CM

## 2012-07-28 DIAGNOSIS — Z8709 Personal history of other diseases of the respiratory system: Secondary | ICD-10-CM | POA: Insufficient documentation

## 2012-07-28 DIAGNOSIS — Z79899 Other long term (current) drug therapy: Secondary | ICD-10-CM | POA: Insufficient documentation

## 2012-07-28 DIAGNOSIS — M25559 Pain in unspecified hip: Secondary | ICD-10-CM | POA: Insufficient documentation

## 2012-07-28 DIAGNOSIS — M87059 Idiopathic aseptic necrosis of unspecified femur: Secondary | ICD-10-CM | POA: Insufficient documentation

## 2012-07-28 DIAGNOSIS — Z8701 Personal history of pneumonia (recurrent): Secondary | ICD-10-CM | POA: Insufficient documentation

## 2012-07-28 DIAGNOSIS — Z8739 Personal history of other diseases of the musculoskeletal system and connective tissue: Secondary | ICD-10-CM | POA: Insufficient documentation

## 2012-07-28 DIAGNOSIS — F10229 Alcohol dependence with intoxication, unspecified: Secondary | ICD-10-CM | POA: Insufficient documentation

## 2012-07-28 DIAGNOSIS — F1410 Cocaine abuse, uncomplicated: Secondary | ICD-10-CM | POA: Insufficient documentation

## 2012-07-28 LAB — CBC WITH DIFFERENTIAL/PLATELET
Eosinophils Absolute: 0.1 10*3/uL (ref 0.0–0.7)
Eosinophils Relative: 1 % (ref 0–5)
HCT: 39.2 % (ref 39.0–52.0)
Hemoglobin: 13.3 g/dL (ref 13.0–17.0)
Lymphocytes Relative: 71 % — ABNORMAL HIGH (ref 12–46)
Lymphs Abs: 5.8 10*3/uL — ABNORMAL HIGH (ref 0.7–4.0)
MCH: 33.8 pg (ref 26.0–34.0)
MCV: 99.5 fL (ref 78.0–100.0)
Monocytes Absolute: 0.6 10*3/uL (ref 0.1–1.0)
Monocytes Relative: 7 % (ref 3–12)
Platelets: 248 10*3/uL (ref 150–400)
RBC: 3.94 MIL/uL — ABNORMAL LOW (ref 4.22–5.81)
WBC: 8.2 10*3/uL (ref 4.0–10.5)

## 2012-07-28 LAB — AMMONIA: Ammonia: 23 umol/L (ref 11–60)

## 2012-07-28 LAB — COMPREHENSIVE METABOLIC PANEL
ALT: 154 U/L — ABNORMAL HIGH (ref 0–53)
BUN: 15 mg/dL (ref 6–23)
CO2: 24 mEq/L (ref 19–32)
Calcium: 8.9 mg/dL (ref 8.4–10.5)
Creatinine, Ser: 0.83 mg/dL (ref 0.50–1.35)
GFR calc Af Amer: >90 mL/min (ref >90)
GFR calc non Af Amer: >90 mL/min (ref >90)
Glucose, Bld: 77 mg/dL (ref 70–99)
Total Protein: 8.1 g/dL (ref 6.0–8.3)

## 2012-07-28 LAB — ACETAMINOPHEN LEVEL: Acetaminophen (Tylenol), Serum: <15 ug/mL (ref 10–30)

## 2012-07-28 LAB — RAPID URINE DRUG SCREEN, HOSP PERFORMED
Amphetamines: NOT DETECTED
Barbiturates: NOT DETECTED
Benzodiazepines: NOT DETECTED

## 2012-07-28 LAB — ETHANOL: Alcohol, Ethyl (B): 262 mg/dL — ABNORMAL HIGH (ref 0–11)

## 2012-07-28 LAB — PROTIME-INR: Prothrombin Time: 13.7 seconds (ref 11.6–15.2)

## 2012-07-28 MED ORDER — TRAMADOL HCL 50 MG PO TABS: 50.0000 mg | ORAL_TABLET | Freq: Four times a day (QID) | ORAL | Status: DC | PRN

## 2012-07-28 MED ORDER — HYDROCODONE-ACETAMINOPHEN 5-325 MG PO TABS: 2.0000 | ORAL_TABLET | ORAL | Status: DC | PRN

## 2012-07-28 MED ORDER — LORAZEPAM 1 MG PO TABS: 1.0000 mg | ORAL_TABLET | Freq: Four times a day (QID) | ORAL | Status: DC | PRN

## 2012-07-28 MED ORDER — NICOTINE 21 MG/24HR TD PT24
21.0000 mg | MEDICATED_PATCH | Freq: Every day | TRANSDERMAL | Status: DC
  Administered 2012-07-28: 21 mg via TRANSDERMAL
  Filled 2012-07-28: qty 1

## 2012-07-28 MED ORDER — LORAZEPAM 1 MG PO TABS: 1.0000 mg | ORAL_TABLET | Freq: Three times a day (TID) | ORAL | Status: DC | PRN

## 2012-07-28 MED ORDER — ZIPRASIDONE MESYLATE 20 MG IM SOLR: 10.0000 mg | Freq: Once | INTRAMUSCULAR | Status: DC

## 2012-07-28 MED ORDER — LORAZEPAM 2 MG/ML IJ SOLN: 1.0000 mg | Freq: Four times a day (QID) | INTRAMUSCULAR | Status: DC | PRN

## 2012-07-28 MED ORDER — TENOFOVIR DISOPROXIL FUMARATE 300 MG PO TABS
300.0000 mg | ORAL_TABLET | Freq: Every morning | ORAL | Status: DC
  Filled 2012-07-28: qty 1

## 2012-07-28 MED ORDER — LOPINAVIR-RITONAVIR 200-50 MG PO TABS
2.0000 | ORAL_TABLET | Freq: Two times a day (BID) | ORAL | Status: DC
  Filled 2012-07-28 (×2): qty 2

## 2012-07-28 MED ORDER — THIAMINE HCL 100 MG/ML IJ SOLN
Freq: Once | INTRAVENOUS | Status: AC
  Administered 2012-07-28: 10:00:00 via INTRAVENOUS
  Filled 2012-07-28: qty 1000

## 2012-07-28 MED ORDER — ZIDOVUDINE 100 MG PO CAPS
300.0000 mg | ORAL_CAPSULE | Freq: Two times a day (BID) | ORAL | Status: DC
  Filled 2012-07-28 (×2): qty 3

## 2012-07-28 NOTE — ED Notes (Signed)
ZOX:[id number]<AV> Expected date:07/28/12<BR> Expected time: 9:02 AM<BR> Means of arrival:Ambulance<BR> Comments:<BR> Pain in groin area, ETOH
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
## 2012-07-28 NOTE — ED Notes (Signed)
Pt requesting a walker upon discharge.

## 2012-07-28 NOTE — ED Notes (Signed)
Pt aware of the need for a urine sample, urinal at bedside. 

## 2012-07-28 NOTE — ED Notes (Signed)
Pt using vulgar language toward staff.
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
## 2012-07-28 NOTE — ED Notes (Signed)
3 belonging bags in locker 27

## 2012-08-06 ENCOUNTER — Ambulatory Visit: Payer: Medicaid Other | Admitting: Internal Medicine

## 2012-08-07 ENCOUNTER — Encounter: Payer: Self-pay | Admitting: Internal Medicine

## 2012-08-07 ENCOUNTER — Ambulatory Visit (INDEPENDENT_AMBULATORY_CARE_PROVIDER_SITE_OTHER): Payer: Medicaid Other | Admitting: Internal Medicine

## 2012-08-07 VITALS — BP 137/87 | HR 58 | Temp 98.1°F | Ht 71.0 in | Wt 144.2 lb

## 2012-08-07 DIAGNOSIS — B20 Human immunodeficiency virus [HIV] disease: Secondary | ICD-10-CM

## 2012-08-07 LAB — CBC
MCH: 32.8 pg (ref 26.0–34.0)
MCHC: 33.7 g/dL (ref 30.0–36.0)
MCV: 97.2 fL (ref 78.0–100.0)
Platelets: 109 10*3/uL — ABNORMAL LOW (ref 150–400)
RDW: 15.1 % (ref 11.5–15.5)
WBC: 7 10*3/uL (ref 4.0–10.5)

## 2012-08-07 LAB — LIPID PANEL: HDL: 61 mg/dL (ref >39)

## 2012-08-07 LAB — COMPREHENSIVE METABOLIC PANEL
ALT: 65 U/L — ABNORMAL HIGH (ref 0–53)
Albumin: 3.9 g/dL (ref 3.5–5.2)
CO2: 26 mEq/L (ref 19–32)
Calcium: 8.7 mg/dL (ref 8.4–10.5)
Chloride: 104 mEq/L (ref 96–112)
Glucose, Bld: 100 mg/dL — ABNORMAL HIGH (ref 70–99)
Potassium: 3.8 mEq/L (ref 3.5–5.3)
Sodium: 136 mEq/L (ref 135–145)
Total Protein: 6.6 g/dL (ref 6.0–8.3)
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
## 2012-08-08 LAB — T-HELPER CELL (CD4) - (RCID CLINIC ONLY): CD4 T Cell Abs: 580 uL (ref 400–2700)

## 2012-08-08 LAB — RPR

## 2012-08-10 ENCOUNTER — Ambulatory Visit (HOSPITAL_COMMUNITY): Payer: Medicaid Other | Admitting: Registered Nurse

## 2012-08-10 ENCOUNTER — Encounter (HOSPITAL_COMMUNITY): Payer: Self-pay | Admitting: Registered Nurse

## 2012-08-10 ENCOUNTER — Ambulatory Visit (HOSPITAL_COMMUNITY): Payer: Medicaid Other

## 2012-08-10 ENCOUNTER — Encounter (HOSPITAL_COMMUNITY)
Admission: RE | Disposition: A | Discharge status: Home Health | Payer: Self-pay | Source: Ambulatory Visit | Attending: Orthopaedic Surgery

## 2012-08-10 ENCOUNTER — Inpatient Hospital Stay (HOSPITAL_COMMUNITY)
Admission: RE | Admit: 2012-08-10 | Discharge: 2012-08-13 | DRG: 470 | Disposition: A | Discharge status: Home Health | Payer: Medicaid Other | Source: Ambulatory Visit | Attending: Orthopaedic Surgery | Admitting: Orthopaedic Surgery

## 2012-08-10 ENCOUNTER — Encounter (HOSPITAL_COMMUNITY): Payer: Self-pay | Admitting: *Deleted

## 2012-08-10 DIAGNOSIS — Z21 Asymptomatic human immunodeficiency virus [HIV] infection status: Secondary | ICD-10-CM | POA: Diagnosis present

## 2012-08-10 DIAGNOSIS — M87052 Idiopathic aseptic necrosis of left femur: Secondary | ICD-10-CM

## 2012-08-10 DIAGNOSIS — M87059 Idiopathic aseptic necrosis of unspecified femur: Secondary | ICD-10-CM | POA: Diagnosis present

## 2012-08-10 DIAGNOSIS — F17200 Nicotine dependence, unspecified, uncomplicated: Secondary | ICD-10-CM | POA: Diagnosis present

## 2012-08-10 DIAGNOSIS — D62 Acute posthemorrhagic anemia: Secondary | ICD-10-CM | POA: Diagnosis not present

## 2012-08-10 DIAGNOSIS — E871 Hypo-osmolality and hyponatremia: Secondary | ICD-10-CM | POA: Diagnosis not present

## 2012-08-10 DIAGNOSIS — Z8619 Personal history of other infectious and parasitic diseases: Secondary | ICD-10-CM

## 2012-08-10 HISTORY — PX: TOTAL HIP ARTHROPLASTY: SHX124

## 2012-08-10 LAB — RAPID URINE DRUG SCREEN, HOSP PERFORMED
Amphetamines: NOT DETECTED
Opiates: NOT DETECTED
Tetrahydrocannabinol: NOT DETECTED

## 2012-08-10 SURGERY — ARTHROPLASTY, HIP, TOTAL, ANTERIOR APPROACH
Anesthesia: General | Site: Hip | Laterality: Left | Wound class: Clean

## 2012-08-10 MED ORDER — LIDOCAINE HCL 4 % MT SOLN
OROMUCOSAL | Status: DC | PRN
  Administered 2012-08-10: 4 mL via TOPICAL

## 2012-08-10 MED ORDER — HYDROMORPHONE HCL PF 1 MG/ML IJ SOLN
INTRAMUSCULAR | Status: AC
  Filled 2012-08-10: qty 1

## 2012-08-10 MED ORDER — MUPIROCIN 2 % EX OINT
TOPICAL_OINTMENT | CUTANEOUS | Status: AC
  Administered 2012-08-10: 22:00:00
  Filled 2012-08-10: qty 22

## 2012-08-10 MED ORDER — ACETAMINOPHEN 650 MG RE SUPP: 650.0000 mg | Freq: Four times a day (QID) | RECTAL | Status: DC | PRN

## 2012-08-10 MED ORDER — ROCURONIUM BROMIDE 100 MG/10ML IV SOLN
INTRAVENOUS | Status: DC | PRN
  Administered 2012-08-10: 50 mg via INTRAVENOUS

## 2012-08-10 MED ORDER — METOCLOPRAMIDE HCL 10 MG PO TABS: 5.0000 mg | ORAL_TABLET | Freq: Three times a day (TID) | ORAL | Status: DC | PRN

## 2012-08-10 MED ORDER — NEOSTIGMINE METHYLSULFATE 1 MG/ML IJ SOLN
INTRAMUSCULAR | Status: DC | PRN
  Administered 2012-08-10: 2 mg via INTRAVENOUS

## 2012-08-10 MED ORDER — ZIDOVUDINE 300 MG PO TABS
300.0000 mg | ORAL_TABLET | Freq: Two times a day (BID) | ORAL | Status: DC
Start: 2012-08-10 — End: 2012-08-10
  Filled 2012-08-10: qty 1

## 2012-08-10 MED ORDER — DOCUSATE SODIUM 100 MG PO CAPS
100.0000 mg | ORAL_CAPSULE | Freq: Two times a day (BID) | ORAL | Status: DC
  Administered 2012-08-10 – 2012-08-13 (×6): 100 mg via ORAL
  Filled 2012-08-10 (×7): qty 1

## 2012-08-10 MED ORDER — LOPINAVIR-RITONAVIR 200-50 MG PO TABS
2.0000 | ORAL_TABLET | Freq: Two times a day (BID) | ORAL | Status: DC
  Administered 2012-08-10 – 2012-08-13 (×6): 2 via ORAL
  Filled 2012-08-10 (×7): qty 2

## 2012-08-10 MED ORDER — SODIUM CHLORIDE 0.9 % IV SOLN
INTRAVENOUS | Status: DC
  Administered 2012-08-10 – 2012-08-11 (×2): via INTRAVENOUS

## 2012-08-10 MED ORDER — ACETAMINOPHEN 325 MG PO TABS
650.0000 mg | ORAL_TABLET | Freq: Four times a day (QID) | ORAL | Status: DC | PRN
  Administered 2012-08-11 – 2012-08-12 (×3): 650 mg via ORAL
  Filled 2012-08-10 (×3): qty 2

## 2012-08-10 MED ORDER — ONDANSETRON HCL 4 MG/2ML IJ SOLN
INTRAMUSCULAR | Status: DC | PRN
  Administered 2012-08-10: 4 mg via INTRAVENOUS

## 2012-08-10 MED ORDER — HYDROMORPHONE HCL PF 1 MG/ML IJ SOLN
1.0000 mg | INTRAMUSCULAR | Status: DC | PRN
  Administered 2012-08-10: 1 mg via INTRAVENOUS
  Filled 2012-08-10: qty 1

## 2012-08-10 MED ORDER — MUPIROCIN CALCIUM 2 % NA OINT
TOPICAL_OINTMENT | Freq: Two times a day (BID) | NASAL | Status: DC
  Administered 2012-08-10 – 2012-08-11 (×2): via NASAL
  Administered 2012-08-11 – 2012-08-12 (×2): 1 via NASAL
  Filled 2012-08-10 (×18): qty 1

## 2012-08-10 MED ORDER — ASPIRIN EC 325 MG PO TBEC
325.0000 mg | DELAYED_RELEASE_TABLET | Freq: Every day | ORAL | Status: DC
  Administered 2012-08-11 – 2012-08-13 (×3): 325 mg via ORAL
  Filled 2012-08-10 (×4): qty 1

## 2012-08-10 MED ORDER — PROMETHAZINE HCL 25 MG/ML IJ SOLN: 6.2500 mg | INTRAMUSCULAR | Status: DC | PRN

## 2012-08-10 MED ORDER — MENTHOL 3 MG MT LOZG
1.0000 | LOZENGE | OROMUCOSAL | Status: DC | PRN
  Filled 2012-08-10: qty 9

## 2012-08-10 MED ORDER — MEPERIDINE HCL 50 MG/ML IJ SOLN: 6.2500 mg | INTRAMUSCULAR | Status: DC | PRN

## 2012-08-10 MED ORDER — ONDANSETRON HCL 4 MG PO TABS: 4.0000 mg | ORAL_TABLET | Freq: Four times a day (QID) | ORAL | Status: DC | PRN

## 2012-08-10 MED ORDER — DIAZEPAM 5 MG/ML IJ SOLN
INTRAMUSCULAR | Status: AC
  Filled 2012-08-10: qty 2

## 2012-08-10 MED ORDER — MIDAZOLAM HCL 5 MG/5ML IJ SOLN
INTRAMUSCULAR | Status: DC | PRN
  Administered 2012-08-10: 2 mg via INTRAVENOUS

## 2012-08-10 MED ORDER — METHOCARBAMOL 100 MG/ML IJ SOLN: 500.0000 mg | Freq: Four times a day (QID) | INTRAVENOUS | Status: DC | PRN

## 2012-08-10 MED ORDER — GLYCOPYRROLATE 0.2 MG/ML IJ SOLN
INTRAMUSCULAR | Status: DC | PRN
  Administered 2012-08-10: 0.4 mg via INTRAVENOUS

## 2012-08-10 MED ORDER — VITAMINS A & D EX OINT
TOPICAL_OINTMENT | CUTANEOUS | Status: AC
  Administered 2012-08-10: 17:00:00
  Filled 2012-08-10: qty 5

## 2012-08-10 MED ORDER — TENOFOVIR DISOPROXIL FUMARATE 300 MG PO TABS
300.0000 mg | ORAL_TABLET | Freq: Every morning | ORAL | Status: DC
  Administered 2012-08-11 – 2012-08-13 (×3): 300 mg via ORAL
  Filled 2012-08-10 (×3): qty 1

## 2012-08-10 MED ORDER — OXYCODONE HCL 5 MG PO TABS
5.0000 mg | ORAL_TABLET | ORAL | Status: DC | PRN
  Administered 2012-08-10 (×2): 5 mg via ORAL
  Administered 2012-08-11: 10 mg via ORAL
  Administered 2012-08-11: 5 mg via ORAL
  Administered 2012-08-11: 10 mg via ORAL
  Administered 2012-08-11: 5 mg via ORAL
  Administered 2012-08-11 – 2012-08-13 (×9): 10 mg via ORAL
  Filled 2012-08-10: qty 2
  Filled 2012-08-10: qty 1
  Filled 2012-08-10 (×6): qty 2
  Filled 2012-08-10: qty 1
  Filled 2012-08-10: qty 2
  Filled 2012-08-10: qty 1
  Filled 2012-08-10 (×3): qty 2
  Filled 2012-08-10: qty 1
  Filled 2012-08-10: qty 2

## 2012-08-10 MED ORDER — PHENOL 1.4 % MT LIQD: 1.0000 | OROMUCOSAL | Status: DC | PRN

## 2012-08-10 MED ORDER — METOCLOPRAMIDE HCL 5 MG/ML IJ SOLN: 5.0000 mg | Freq: Three times a day (TID) | INTRAMUSCULAR | Status: DC | PRN

## 2012-08-10 MED ORDER — ALUM & MAG HYDROXIDE-SIMETH 200-200-20 MG/5ML PO SUSP: 30.0000 mL | ORAL | Status: DC | PRN

## 2012-08-10 MED ORDER — ZOLPIDEM TARTRATE 5 MG PO TABS: 5.0000 mg | ORAL_TABLET | Freq: Every evening | ORAL | Status: DC | PRN

## 2012-08-10 MED ORDER — ZIDOVUDINE 100 MG PO CAPS
300.0000 mg | ORAL_CAPSULE | Freq: Two times a day (BID) | ORAL | Status: DC
  Administered 2012-08-10 – 2012-08-13 (×6): 300 mg via ORAL
  Filled 2012-08-10 (×7): qty 3

## 2012-08-10 MED ORDER — SODIUM CHLORIDE 0.9 % IR SOLN
Status: DC | PRN
  Administered 2012-08-10: 1000 mL

## 2012-08-10 MED ORDER — STERILE WATER FOR IRRIGATION IR SOLN
Status: DC | PRN
  Administered 2012-08-10: 3000 mL

## 2012-08-10 MED ORDER — ONDANSETRON HCL 4 MG/2ML IJ SOLN: 4.0000 mg | Freq: Four times a day (QID) | INTRAMUSCULAR | Status: DC | PRN

## 2012-08-10 MED ORDER — SUFENTANIL CITRATE 50 MCG/ML IV SOLN
INTRAVENOUS | Status: DC | PRN
  Administered 2012-08-10 (×2): 10 ug via INTRAVENOUS
  Administered 2012-08-10: 5 ug via INTRAVENOUS
  Administered 2012-08-10: 10 ug via INTRAVENOUS

## 2012-08-10 MED ORDER — PROPOFOL 10 MG/ML IV BOLUS
INTRAVENOUS | Status: DC | PRN
  Administered 2012-08-10: 200 mg via INTRAVENOUS

## 2012-08-10 MED ORDER — LACTATED RINGERS IV SOLN: INTRAVENOUS | Status: DC

## 2012-08-10 MED ORDER — OXYCODONE HCL ER 20 MG PO T12A
20.0000 mg | EXTENDED_RELEASE_TABLET | Freq: Two times a day (BID) | ORAL | Status: DC
  Administered 2012-08-10 – 2012-08-13 (×6): 20 mg via ORAL
  Filled 2012-08-10 (×6): qty 1

## 2012-08-10 MED ORDER — CEFAZOLIN SODIUM-DEXTROSE 2-3 GM-% IV SOLR
INTRAVENOUS | Status: AC
  Filled 2012-08-10: qty 50

## 2012-08-10 MED ORDER — LACTATED RINGERS IV SOLN
INTRAVENOUS | Status: DC
  Administered 2012-08-10 (×4): via INTRAVENOUS

## 2012-08-10 MED ORDER — DIPHENHYDRAMINE HCL 12.5 MG/5ML PO ELIX: 12.5000 mg | ORAL_SOLUTION | ORAL | Status: DC | PRN

## 2012-08-10 MED ORDER — PHENYLEPHRINE HCL 10 MG/ML IJ SOLN
INTRAMUSCULAR | Status: DC | PRN
  Administered 2012-08-10: 40 ug via INTRAVENOUS
  Administered 2012-08-10: 80 ug via INTRAVENOUS
  Administered 2012-08-10: 40 ug via INTRAVENOUS
  Administered 2012-08-10: 80 ug via INTRAVENOUS
  Administered 2012-08-10 (×2): 120 ug via INTRAVENOUS
  Administered 2012-08-10: 40 ug via INTRAVENOUS

## 2012-08-10 MED ORDER — CEFAZOLIN SODIUM 1-5 GM-% IV SOLN
1.0000 g | Freq: Four times a day (QID) | INTRAVENOUS | Status: AC
  Administered 2012-08-10 – 2012-08-11 (×2): 1 g via INTRAVENOUS
  Filled 2012-08-10 (×2): qty 50

## 2012-08-10 MED ORDER — CEFAZOLIN SODIUM-DEXTROSE 2-3 GM-% IV SOLR
2.0000 g | INTRAVENOUS | Status: AC
  Administered 2012-08-10: 2 g via INTRAVENOUS
  Filled 2012-08-10: qty 50

## 2012-08-10 MED ORDER — METHOCARBAMOL 500 MG PO TABS
500.0000 mg | ORAL_TABLET | Freq: Four times a day (QID) | ORAL | Status: DC | PRN
  Administered 2012-08-10 – 2012-08-13 (×7): 500 mg via ORAL
  Filled 2012-08-10 (×7): qty 1

## 2012-08-10 MED ORDER — 0.9 % SODIUM CHLORIDE (POUR BTL) OPTIME
TOPICAL | Status: DC | PRN
  Administered 2012-08-10: 1000 mL

## 2012-08-10 MED ORDER — DIAZEPAM 5 MG/ML IJ SOLN
2.5000 mg | INTRAMUSCULAR | Status: DC
  Administered 2012-08-10: 2.5 mg via INTRAVENOUS

## 2012-08-10 MED ORDER — ENSURE PLUS PO LIQD
237.0000 mL | Freq: Two times a day (BID) | ORAL | Status: DC
  Administered 2012-08-10 – 2012-08-12 (×4): 237 mL via ORAL
  Filled 2012-08-10 (×5): qty 237

## 2012-08-10 MED ORDER — HYDROMORPHONE HCL PF 1 MG/ML IJ SOLN
0.2500 mg | INTRAMUSCULAR | Status: DC | PRN
  Administered 2012-08-10 (×3): 0.5 mg via INTRAVENOUS

## 2012-08-10 SURGICAL SUPPLY — 43 items
ADH SKN CLS APL DERMABOND .7 (GAUZE/BANDAGES/DRESSINGS) ×1
BAG SPEC THK2 15X12 ZIP CLS (MISCELLANEOUS) ×2
BAG ZIPLOCK 12X15 (MISCELLANEOUS) ×4 IMPLANT
BLADE SAW SGTL 18X1.27X75 (BLADE) ×2 IMPLANT
CELLS DAT CNTRL 66122 CELL SVR (MISCELLANEOUS) ×1 IMPLANT
CLOTH BEACON ORANGE TIMEOUT ST (SAFETY) ×2 IMPLANT
DERMABOND ADVANCED (GAUZE/BANDAGES/DRESSINGS) ×1
DERMABOND ADVANCED .7 DNX12 (GAUZE/BANDAGES/DRESSINGS) ×1 IMPLANT
DRAPE C-ARM 42X72 X-RAY (DRAPES) ×2 IMPLANT
DRAPE STERI IOBAN 125X83 (DRAPES) ×2 IMPLANT
DRAPE U-SHAPE 47X51 STRL (DRAPES) ×6 IMPLANT
DRSG AQUACEL AG ADV 3.5X10 (GAUZE/BANDAGES/DRESSINGS) ×2 IMPLANT
DURAPREP 26ML APPLICATOR (WOUND CARE) ×2 IMPLANT
ELECT BLADE TIP CTD 4 INCH (ELECTRODE) ×2 IMPLANT
ELECT REM PT RETURN 9FT ADLT (ELECTROSURGICAL) ×2
ELECTRODE REM PT RTRN 9FT ADLT (ELECTROSURGICAL) ×1 IMPLANT
FACESHIELD LNG OPTICON STERILE (SAFETY) ×8 IMPLANT
GLOVE BIO SURGEON STRL SZ7 (GLOVE) ×2 IMPLANT
GLOVE BIO SURGEON STRL SZ7.5 (GLOVE) ×2 IMPLANT
GLOVE BIOGEL PI IND STRL 7.5 (GLOVE) IMPLANT
GLOVE BIOGEL PI IND STRL 8 (GLOVE) ×1 IMPLANT
GLOVE BIOGEL PI INDICATOR 7.5 (GLOVE)
GLOVE BIOGEL PI INDICATOR 8 (GLOVE) ×1
GLOVE ECLIPSE 7.0 STRL STRAW (GLOVE) ×2 IMPLANT
GOWN STRL REIN XL XLG (GOWN DISPOSABLE) ×4 IMPLANT
HANDPIECE INTERPULSE COAX TIP (DISPOSABLE) ×2
KIT BASIN OR (CUSTOM PROCEDURE TRAY) ×2 IMPLANT
PACK TOTAL JOINT (CUSTOM PROCEDURE TRAY) ×2 IMPLANT
PADDING CAST COTTON 6X4 STRL (CAST SUPPLIES) ×2 IMPLANT
RETRACTOR WND ALEXIS 18 MED (MISCELLANEOUS) ×1 IMPLANT
RTRCTR WOUND ALEXIS 18CM MED (MISCELLANEOUS) ×2
SET HNDPC FAN SPRY TIP SCT (DISPOSABLE) ×1 IMPLANT
SUT ETHIBOND NAB CT1 #1 30IN (SUTURE) ×4 IMPLANT
SUT MNCRL AB 4-0 PS2 18 (SUTURE) ×2 IMPLANT
SUT VIC AB 1 CT1 36 (SUTURE) ×4 IMPLANT
SUT VIC AB 2-0 CT1 27 (SUTURE) ×4
SUT VIC AB 2-0 CT1 TAPERPNT 27 (SUTURE) ×2 IMPLANT
SUT VIC AB 3-0 CT1 27 (SUTURE) ×2
SUT VIC AB 3-0 CT1 TAPERPNT 27 (SUTURE) IMPLANT
SUT VLOC 180 0 24IN GS25 (SUTURE) ×2 IMPLANT
TOWEL OR 17X26 10 PK STRL BLUE (TOWEL DISPOSABLE) ×4 IMPLANT
TOWEL OR NON WOVEN STRL DISP B (DISPOSABLE) ×2 IMPLANT
TRAY FOLEY CATH 14FRSI W/METER (CATHETERS) ×2 IMPLANT
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
## 2012-08-10 NOTE — Brief Op Note (Signed)
08/10/2012  3:00 PM  PATIENT:  [name]  62 y.o. male  PRE-OPERATIVE DIAGNOSIS:  Osteoarthritis left hip  POST-OPERATIVE DIAGNOSIS:  Osteoarthritis left hip  PROCEDURE:  Procedure(s): Left TOTAL HIP ARTHROPLASTY ANTERIOR APPROACH (Left)  SURGEON:  Surgeon(s) and Role:    * Kathryne Hitch, MD - Primary  PHYSICIAN ASSISTANT:   ASSISTANTS: Rexene Edison, PA-C   ANESTHESIA:   general  EBL:  Total I/O In: 2000 [I.V.:2000] Out: 435 [Urine:235; Blood:200]  BLOOD ADMINISTERED:none  DRAINS: none   LOCAL MEDICATIONS USED:  NONE  SPECIMEN:  No Specimen  DISPOSITION OF SPECIMEN:  N/A  COUNTS:  YES  TOURNIQUET:  * No tourniquets in log *  DICTATION: .Other Dictation: Dictation Number [id number]  PLAN OF CARE: Admit to inpatient   PATIENT DISPOSITION:  PACU - hemodynamically stable.   Delay start of Pharmacological VTE agent (>24hrs) due to surgical blood loss or risk of bleeding: no

## 2012-08-10 NOTE — Anesthesia Postprocedure Evaluation (Addendum)
  Anesthesia Post-op Note  Patient: [name]  Procedure(s) Performed: Procedure(s) (LRB): Left TOTAL HIP ARTHROPLASTY ANTERIOR APPROACH (Left)  Patient Location: PACU  Anesthesia Type: GA  Level of Consciousness: awake and alert   Airway and Oxygen Therapy: Patient Spontanous Breathing  Post-op Pain: mild  Post-op Assessment: Post-op Vital signs reviewed, Patient's Cardiovascular Status Stable, Respiratory Function Stable, Patent Airway and No signs of Nausea or vomiting  Last Vitals:  Filed Vitals:   08/10/12 1640  BP: 134/77  Pulse: 61  Temp: 36.7 C  Resp: 14    Post-op Vital Signs: stable   Complications: No apparent anesthesia complications

## 2012-08-10 NOTE — Plan of Care (Signed)
Problem: Consults Goal: Diagnosis- Total Joint Replacement Primary Total Hip     

## 2012-08-10 NOTE — Transfer of Care (Signed)
Immediate Anesthesia Transfer of Care Note  Patient: [name]  Procedure(s) Performed: Procedure(s): Left TOTAL HIP ARTHROPLASTY ANTERIOR APPROACH (Left)  Patient Location: PACU  Anesthesia Type:General  Level of Consciousness: awake, alert , oriented and patient cooperative  Airway & Oxygen Therapy: Patient Spontanous Breathing and Patient connected to face mask oxygen  Post-op Assessment: Report given to PACU RN, Post -op Vital signs reviewed and stable and Patient moving all extremities X 4  Post vital signs: stable  Complications: No apparent anesthesia complications

## 2012-08-10 NOTE — Care Management (Signed)
[facility] is following this patient, Please notifiy if discharges over the weekend.  [phone]  Fax   [phone].  [name]

## 2012-08-10 NOTE — Anesthesia Preprocedure Evaluation (Addendum)
Anesthesia Evaluation  Patient identified by MRN, date of birth, ID band Patient awake    Reviewed: Allergy & Precautions, H&P , NPO status , Patient's Chart, lab work & pertinent test results  Airway Mallampati: II TM Distance: >3 FB Neck ROM: Full    Dental no notable dental hx. (+) Poor Dentition   Pulmonary neg pulmonary ROS, Current Smoker,  breath sounds clear to auscultation  Pulmonary exam normal       Cardiovascular negative cardio ROS  Rhythm:Regular Rate:Normal     Neuro/Psych negative neurological ROS  negative psych ROS   GI/Hepatic negative GI ROS, Neg liver ROS, (+)     substance abuse  alcohol use and cocaine use, Hepatitis -, B and C  Endo/Other  negative endocrine ROS  Renal/GU negative Renal ROS  negative genitourinary   Musculoskeletal negative musculoskeletal ROS (+)   Abdominal   Peds negative pediatric ROS (+)  Hematology negative hematology ROS (+) HIV,   Anesthesia Other Findings   Reproductive/Obstetrics negative OB ROS                          Anesthesia Physical Anesthesia Plan  ASA: III  Anesthesia Plan: General   Post-op Pain Management:    Induction: Intravenous  Airway Management Planned: Oral ETT  Additional Equipment:   Intra-op Plan:   Post-operative Plan: Extubation in OR  Informed Consent: I have reviewed the patients History and Physical, chart, labs and discussed the procedure including the risks, benefits and alternatives for the proposed anesthesia with the patient or authorized representative who has indicated his/her understanding and acceptance.   Dental advisory given  Plan Discussed with: CRNA  Anesthesia Plan Comments: (No tylenol)       Anesthesia Quick Evaluation

## 2012-08-11 LAB — BASIC METABOLIC PANEL
Chloride: 98 mEq/L (ref 96–112)
Creatinine, Ser: 0.7 mg/dL (ref 0.50–1.35)
GFR calc Af Amer: >90 mL/min (ref >90)
GFR calc non Af Amer: >90 mL/min (ref >90)

## 2012-08-11 LAB — CBC
MCHC: 32.9 g/dL (ref 30.0–36.0)
MCV: 100.6 fL — ABNORMAL HIGH (ref 78.0–100.0)
Platelets: 132 10*3/uL — ABNORMAL LOW (ref 150–400)
RDW: 14.6 % (ref 11.5–15.5)
WBC: 7.8 10*3/uL (ref 4.0–10.5)

## 2012-08-11 MED ORDER — SODIUM CHLORIDE 0.9 % IV BOLUS (SEPSIS)
500.0000 mL | Freq: Once | INTRAVENOUS | Status: AC
  Administered 2012-08-11: 500 mL via INTRAVENOUS

## 2012-08-11 NOTE — Progress Notes (Signed)
Physical Therapy Treatment Patient Details Name: Trayveon Gabriele Loveland MRN: [id number] DOB: 1950-12-08 Today's Date: 08/11/2012 Time: 9562-1308 PT Time Calculation (min): 20 min  PT Assessment / Plan / Recommendation Comments on Treatment Session       Follow Up Recommendations  Home health PT     Does the patient have the potential to tolerate intense rehabilitation     Barriers to Discharge        Equipment Recommendations  Rolling walker with 5" wheels    Recommendations for Other Services OT consult  Frequency 7X/week   Plan Discharge plan remains appropriate    Precautions / Restrictions Precautions Precautions: Fall Restrictions Weight Bearing Restrictions: No Other Position/Activity Restrictions: WBAT   Pertinent Vitals/Pain     Mobility  Bed Mobility Bed Mobility: Supine to Sit Supine to Sit: 4: Min assist Details for Bed Mobility Assistance: cues for sequence and use of R LE and UEs to self assist Transfers Transfers: Sit to Stand;Stand to Sit Sit to Stand: 4: Min assist;3: Mod assist Stand to Sit: 4: Min assist Details for Transfer Assistance: cues for LE management and use of UEs to self assist Ambulation/Gait Ambulation/Gait Assistance: 4: Min assist Ambulation Distance (Feet): 124 Feet Assistive device: Rolling walker Ambulation/Gait Assistance Details: cues for posture, sequence, stride length and position from RW Gait Pattern: Step-to pattern    Exercises     PT Diagnosis:    PT Problem List:   PT Treatment Interventions:     PT Goals Acute Rehab PT Goals PT Goal Formulation: With patient Time For Goal Achievement: 08/15/12 Potential to Achieve Goals: Good Pt will go Supine/Side to Sit: with supervision PT Goal: Supine/Side to Sit - Progress: Goal set today Pt will go Sit to Supine/Side: with supervision PT Goal: Sit to Supine/Side - Progress: Goal set today Pt will go Sit to Stand: with supervision PT Goal: Sit to Stand - Progress: Goal set  today Pt will go Stand to Sit: with supervision PT Goal: Stand to Sit - Progress: Goal set today Pt will Ambulate: 51 - 150 feet;with supervision;with rolling walker PT Goal: Ambulate - Progress: Goal set today Pt will Go Up / Down Stairs: 1-2 stairs;with min assist;with least restrictive assistive device PT Goal: Up/Down Stairs - Progress: Goal set today  Visit Information  Last PT Received On: 01/18/13 Assistance Needed: +1    Subjective Data  Patient Stated Goal: Walk again wtih decreased pain   Cognition  Cognition Overall Cognitive Status: Appears within functional limits for tasks assessed/performed Arousal/Alertness: Awake/alert Orientation Level: Appears intact for tasks assessed Behavior During Session: [facility] for tasks performed    Balance     End of Session PT - End of Session Equipment Utilized During Treatment: Gait belt Activity Tolerance: Patient tolerated treatment well Patient left: in chair;with call bell/phone within reach;with nursing in room Nurse Communication: Mobility status   GP     BRADSHAW,HUNTER 08/11/2012, 5:35 PM

## 2012-08-11 NOTE — Progress Notes (Signed)
Physical Therapy Evaluation Patient Details Name: [name] MRN: [id number] DOB: [birth year] Today's Date: 08/11/2012 Time: 4098-1191 PT Time Calculation (min): 28 min  PT Assessment / Plan / Recommendation Clinical Impression  Pt s/p L THR presents with decreased L LE strength/ROM and post op pain limiting functional mobility    PT Assessment  Patient needs continued PT services    Follow Up Recommendations  Home health PT    Does the patient have the potential to tolerate intense rehabilitation      Barriers to Discharge None      Equipment Recommendations  Rolling walker with 5" wheels    Recommendations for Other Services OT consult   Frequency 7X/week    Precautions / Restrictions Precautions Precautions: Fall Restrictions Weight Bearing Restrictions: No Other Position/Activity Restrictions: WBAT   Pertinent Vitals/Pain 4/10; premed, ice packs provided      Mobility  Bed Mobility Bed Mobility: Supine to Sit Supine to Sit: 3: Mod assist Details for Bed Mobility Assistance: cues for sequence and use of R LE and UEs to self assist Transfers Transfers: Sit to Stand;Stand to Sit Sit to Stand: 3: Mod assist Stand to Sit: 3: Mod assist Details for Transfer Assistance: cues for LE management and use of UEs to self assist Ambulation/Gait Ambulation/Gait Assistance: 4: Min assist;3: Mod assist Ambulation Distance (Feet): 42 Feet Assistive device: Rolling walker Ambulation/Gait Assistance Details: cues for posture, sequence and position from RW Gait Pattern: Step-to pattern    Exercises Total Joint Exercises Ankle Circles/Pumps: AROM;10 reps;Supine;Both Quad Sets: AROM;10 reps;Supine;Both Heel Slides: AAROM;10 reps;Supine;Left Hip ABduction/ADduction: AAROM;10 reps;Supine;Left   PT Diagnosis: Difficulty walking  PT Problem List: Decreased strength;Decreased range of motion;Decreased activity tolerance;Decreased mobility;Decreased knowledge of use of  DME;Pain PT Treatment Interventions: DME instruction;Gait training;Stair training;Functional mobility training;Therapeutic activities;Therapeutic exercise;Patient/family education   PT Goals Acute Rehab PT Goals PT Goal Formulation: With patient Time For Goal Achievement: 08/15/12 Potential to Achieve Goals: Good Pt will go Supine/Side to Sit: with supervision PT Goal: Supine/Side to Sit - Progress: Goal set today Pt will go Sit to Supine/Side: with supervision PT Goal: Sit to Supine/Side - Progress: Goal set today Pt will go Sit to Stand: with supervision PT Goal: Sit to Stand - Progress: Goal set today Pt will go Stand to Sit: with supervision PT Goal: Stand to Sit - Progress: Goal set today Pt will Ambulate: 51 - 150 feet;with supervision;with rolling walker PT Goal: Ambulate - Progress: Goal set today Pt will Go Up / Down Stairs: 1-2 stairs;with min assist;with least restrictive assistive device PT Goal: Up/Down Stairs - Progress: Goal set today  Visit Information  Last PT Received On: 08/11/12 Assistance Needed: +1    Subjective Data  Subjective: I was walking over eight miles a day before I came for surgery; I went jogging the day before I came in Patient Stated Goal: Walk again wtih decreased pain   Prior Functioning  Home Living Lives With: Spouse (But going to sister's home. Wife is "unreliable") Available Help at Discharge: Family Type of Home: House Home Access: Stairs to enter Secretary/administrator of Steps: 2 Entrance Stairs-Rails: Right;Left;Can reach both Home Layout: One level Home Adaptive Equipment: None Prior Function Level of Independence: Independent Able to Take Stairs?: Yes Communication Communication: No difficulties Dominant Hand: Right    Cognition  Cognition Overall Cognitive Status: Appears within functional limits for tasks assessed/performed Arousal/Alertness: Awake/alert Orientation Level: Appears intact for tasks assessed Behavior  During Session: [facility] for tasks performed  Extremity/Trunk Assessment Right Upper Extremity Assessment RUE ROM/Strength/Tone: WFL for tasks assessed Left Upper Extremity Assessment LUE ROM/Strength/Tone: WFL for tasks assessed Right Lower Extremity Assessment RLE ROM/Strength/Tone: [facility] for tasks assessed Left Lower Extremity Assessment LLE ROM/Strength/Tone: Deficits LLE ROM/Strength/Tone Deficits: Hip strength 2+/5  with AAROM at hip to 90 hip flex and 25 abd   Balance    End of Session PT - End of Session Equipment Utilized During Treatment: Gait belt Activity Tolerance: Patient tolerated treatment well Patient left: in chair;with call bell/phone within reach;with nursing in room Nurse Communication: Mobility status  GP     [name] 08/11/2012, 5:25 PM

## 2012-08-11 NOTE — Progress Notes (Signed)
Subjective: 1 Day Post-Op Procedure(s) (LRB): Left TOTAL HIP ARTHROPLASTY ANTERIOR APPROACH (Left) Patient reports pain as moderate.  Asymptomatic acute blood loss anemia and hyponatremia, but not treating either.  Objective: Vital signs in last 24 hours: Temp:  [97.2 F (36.2 C)-100.8 F (38.2 C)] 100.8 F (38.2 C) (03/08 0605) Pulse Rate:  [56-93] 82 (03/08 0605) Resp:  [9-20] 16 (03/08 0605) BP: (110-140)/(65-86) 122/67 mmHg (03/08 0605) SpO2:  [100 %] 100 % (03/08 0605) Weight:  [65.318 kg (144 lb)] 65.318 kg (144 lb) (03/07 1630)  Intake/Output from previous day: 03/07 0701 - 03/08 0700 In: 5025 [P.O.:840; I.V.:4185] Out: 2735 [Urine:2535; Blood:200] Intake/Output this shift:     Recent Labs  08/11/12 0505  HGB 10.7*    Recent Labs  08/11/12 0505  WBC 7.8  RBC 3.23*  HCT 32.5*  PLT 132*    Recent Labs  08/11/12 0505  NA 132*  K 4.2  CL 98  CO2 27  BUN 7  CREATININE 0.70  GLUCOSE 118*  CALCIUM 8.6   No results found for this basename: LABPT, INR,  in the last 72 hours  Sensation intact distally Intact pulses distally Dorsiflexion/Plantar flexion intact Incision: dressing C/D/I  Assessment/Plan: 1 Day Post-Op Procedure(s) (LRB): Left TOTAL HIP ARTHROPLASTY ANTERIOR APPROACH (Left) Up with therapy  BLACKMAN,CHRISTOPHER Y 08/11/2012, 9:50 AM

## 2012-08-11 NOTE — Progress Notes (Signed)
OT Cancellation Note  Patient Details Name: Robson Theoden Mauch MRN: [id number] DOB: May 27, 1951   Cancelled Treatment:    Reason Eval/Treat Not Completed: Medical issues which prohibited therapy:  Pt with tachycardia.  Will check back tomorrow.    SPENCER,MARYELLEN 08/11/2012, 2:41 PM Marica Otter, OTR/L [phone] 08/11/2012

## 2012-08-11 NOTE — Op Note (Signed)
NAME:  [name], [name]            ACCOUNT NO.:  [id number]  MEDICAL RECORD NO.:  [id number]  LOCATION:  1524                         FACILITY:  [facility]  PHYSICIAN:  Vanita Panda. Magnus Ivan, M.D.DATE OF BIRTH:  April 12, 1951  DATE OF PROCEDURE:  08/10/2012 DATE OF DISCHARGE:                              OPERATIVE REPORT   PREOPERATIVE DIAGNOSIS:  Avascular necrosis, left hip, status post femoral neck fracture.  POSTOPERATIVE DIAGNOSIS:  Avascular necrosis, left hip, status post femoral neck fracture.Marland Kitchen  PROCEDURE:  Left total hip arthroplasty through direct anterior approach.  IMPLANTS:  DePuy Sector Gription acetabular component size 54, size 36+ 4 neutral polyethylene liner, Corail femoral component size 12 with standard offset, size 36+ 1.5 ceramic hip ball.  SURGEON:  Doneen Poisson, MD  ASSISTANT:  Richardean Canal, P.A., whose assistance was integral throughout the case.  He was there from the beginning to the end and was involved with the patient's positioning, exposure of the left hip, implant placement, and closure of the wound.  ANESTHESIA:  General.  ANTIBIOTICS:  2 g IV Ancef.  ESTIMATED BLOOD LOSS:  Less than 500 mL.  COMPLICATIONS:  None.  INDICATIONS:  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  This started with a femoral neck fracture, the one untreated, and his pain is daily.  He is on narcotics.  He has even been occasionally abusing drugs.  I have talked in detail to him as well as Infectious Disease doctors told that his quality of life would be better with a hip replacement.  I have lectured him extensively and about the risk of infection, given his poor immunocompromise status.  He understands this, but given his severity of his pain and this poor quality of life, he wishes to have a total hip arthroplasty.  The risks and benefits of the surgery were  explained to him in detail, and he does wish to proceed.  PROCEDURE DESCRIPTION:  After informed consent was obtained, appropriate left hip was marked.  He was brought to the operating room.  General anesthesia was obtained while he was on the stretcher.  Foley catheter was placed and then traction boots were placed on his feet.  He was next placed supine on the Hana fracture table with the perineal post in place and both legs in inline skeletal traction, but no traction applied.  His left hip was then assessed under direct fluoroscopy.  We were able to gain his leg length with measuring center of the pelvis.  Next, we prepped the left hip with DuraPrep, and sterile drapes were applied.  A time-out was called to identify the correct patient, correct left hip. We then made an incision just posterior and inferior to the anterior superior iliac spine and carried this obliquely down the leg.  I dissected down to the tensor fascia lata.  The tensor fascia was divided longitudinally.  I then proceeded with a direct anterior approach to the hip.  A Cobra retractors were placed around the lateral neck.  I cauterized the lateral femoral circumflex vessels and placed a Cobra retractor around the medial neck.  I opened up  the joint capsule in an L- type format and placed the Cobra retractors within the hip capsule.  I then made my femoral neck cut with an oscillating saw just proximal to the lesser trochanter and completed this with an osteotome.  I placed a corkscrew guide in the femoral head and removed the femoral head in its entirety and found that the soft and have collapse already with findings consistent with avascular necrosis.  I then cleaned the acetabulum of debris, and remnant of the labrum, released the transverse acetabular ligament and then placed a bent Hohmann medially and a Cobra retractor laterally.  I then began reaming from size 42 reamer in 2 mm increments all the way up to a  size 54, all reamers were placed under direct visualization.  The last reamer was also placed under direct fluoroscopy, so we could obtain our depth of reamer, inclination and anteversion.  Once this was done, we chose the real Sector Gription acetabular component size 54, and placed this into the pelvis knocking into place.  I placed the apex hole eliminator guide and then the real 36+ 4 neutral polyethylene liner.  Attention was then turned to the femur.  The leg was externally rotated to 90 degrees, extended and adducted.  I gained access to the femoral canal using a box cutting guide.  A medial retractor was placed medially and a Bent Hohmann behind the greater trochanter.  I released the lateral capsule as well.  I then began broaching from a size 8 broach all up to a size 12, and the 12 was felt to be stable and filled the canal.  I trialed a standard neck and a 36+ 1.5 hip ball.  We brought the leg back over and up and with the traction and internal rotation reduced the components into the acetabulum.  We then put him through internal and external rotation.  It was stable.  He had minimal shuck and then we measured leg lengths under direct fluoroscopy, and I was pleased with position of the implants and his leg lengths were equal.  We then dislocated the hip and removed the trial components.  I then placed the real Corail femoral component size 12 with standard offset and the real 36+ 1.5 ceramic hip ball.  Again, we brought the leg back over and up and reduced the hip.  We then irrigated the soft tissues with normal saline solution using pulsatile lavage.  We closed the joint capsule with interrupted #1 Ethibond suture followed by running 0 V-Loc and the tensor fascia, 2-0 Vicryl in the deep and subcutaneous tissue, 4-0 Monocryl on the skin from the subcuticular tissue and Dermabond on the skin.  An Aquacel dressing was applied.  He was then taken off the Hana table, awakened,  extubated, and taken to recovery room in stable condition.  All final counts were correct.  There were no complications noted.  Again, of note, Richardean Canal, physician assistant, assisted throughout the case, and his assistance was integral in having a good outcome from this case.     Vanita Panda. Magnus Ivan, M.D.     CYB/MEDQ  D:  08/10/2012  T:  08/11/2012  Job:  [id number]

## 2012-08-12 LAB — CBC
MCH: 33.3 pg (ref 26.0–34.0)
MCHC: 32.9 g/dL (ref 30.0–36.0)
MCV: 101.3 fL — ABNORMAL HIGH (ref 78.0–100.0)
Platelets: ADEQUATE 10*3/uL (ref 150–400)
RBC: 3.03 MIL/uL — ABNORMAL LOW (ref 4.22–5.81)
RDW: 14.5 % (ref 11.5–15.5)

## 2012-08-12 MED ORDER — MUPIROCIN 2 % EX OINT: TOPICAL_OINTMENT | Freq: Two times a day (BID) | CUTANEOUS | Status: DC

## 2012-08-12 MED ORDER — ENSURE PLUS PO LIQD
237.0000 mL | Freq: Three times a day (TID) | ORAL | Status: DC
  Administered 2012-08-12 – 2012-08-13 (×3): 237 mL via ORAL
  Filled 2012-08-12 (×5): qty 237

## 2012-08-12 MED ORDER — ASPIRIN 325 MG PO TBEC: 325.0000 mg | DELAYED_RELEASE_TABLET | Freq: Every day | ORAL | Status: DC

## 2012-08-12 MED ORDER — OXYCODONE HCL ER 20 MG PO T12A: 20.0000 mg | EXTENDED_RELEASE_TABLET | Freq: Two times a day (BID) | ORAL | Status: DC

## 2012-08-12 MED ORDER — METHOCARBAMOL 500 MG PO TABS: 500.0000 mg | ORAL_TABLET | Freq: Four times a day (QID) | ORAL | Status: DC | PRN

## 2012-08-12 MED ORDER — DOXYCYCLINE HYCLATE 100 MG PO TABS: 100.0000 mg | ORAL_TABLET | Freq: Two times a day (BID) | ORAL | Status: DC

## 2012-08-12 MED ORDER — OXYCODONE HCL 5 MG PO TABS
5.0000 mg | ORAL_TABLET | ORAL | Status: DC | PRN
Start: 2012-08-12 — End: 2013-11-05

## 2012-08-12 MED ORDER — DOXYCYCLINE HYCLATE 100 MG PO TABS
100.0000 mg | ORAL_TABLET | Freq: Two times a day (BID) | ORAL | Status: DC
  Administered 2012-08-12 – 2012-08-13 (×3): 100 mg via ORAL
  Filled 2012-08-12 (×4): qty 1
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
## 2012-08-12 NOTE — Progress Notes (Signed)
Subjective: 2 Days Post-Op Procedure(s) (LRB): Left TOTAL HIP ARTHROPLASTY ANTERIOR APPROACH (Left) Patient reports pain as moderate.    Objective: Vital signs in last 24 hours: Temp:  [98.6 F (37 C)-103.1 F (39.5 C)] 98.7 F (37.1 C) (03/09 0735) Pulse Rate:  [83-115] 93 (03/09 0520) Resp:  [15-18] 16 (03/09 0520) BP: (93-115)/(59-69) 96/59 mmHg (03/09 0520) SpO2:  [92 %-98 %] 95 % (03/09 0520)  Intake/Output from previous day: 03/08 0701 - 03/09 0700 In: 480 [P.O.:480] Out: 700 [Urine:700] Intake/Output this shift: Total I/O In: 240 [P.O.:240] Out: 100 [Urine:100]   Recent Labs  08/11/12 0505 08/12/12 0429  HGB 10.7* 10.1*    Recent Labs  08/11/12 0505 08/12/12 0429  WBC 7.8 11.6*  RBC 3.23* 3.03*  HCT 32.5* 30.7*  PLT 132* PLATELET CLUMPS NOTED ON SMEAR, COUNT APPEARS ADEQUATE    Recent Labs  08/11/12 0505  NA 132*  K 4.2  CL 98  CO2 27  BUN 7  CREATININE 0.70  GLUCOSE 118*  CALCIUM 8.6   No results found for this basename: LABPT, INR,  in the last 72 hours  Sensation intact distally Intact pulses distally Incision: dressing C/D/I Compartment soft  Assessment/Plan: 2 Days Post-Op Procedure(s) (LRB): Left TOTAL HIP ARTHROPLASTY ANTERIOR APPROACH (Left) Will place on empirical Doxy due to elevated WBC count Plan d/c home tomorrow OOB with PT  Richardean Canal 08/12/2012, 9:33 AM

## 2012-08-12 NOTE — Progress Notes (Signed)
UR completed 

## 2012-08-12 NOTE — Progress Notes (Signed)
Physical Therapy Treatment Patient Details Name: [name] MRN: [id number] DOB: 07/19/1950 Today's Date: 08/12/2012 Time: 4098-1191 PT Time Calculation (min): 24 min  PT Assessment / Plan / Recommendation Comments on Treatment Session  Pt is progressing well with mobility, he walked 250' with RW today, stair training completed. OK to DC home from PT standpoint.     Follow Up Recommendations  Home health PT     Does the patient have the potential to tolerate intense rehabilitation     Barriers to Discharge        Equipment Recommendations  Rolling walker with 5" wheels    Recommendations for Other Services OT consult  Frequency 7X/week   Plan Discharge plan remains appropriate    Precautions / Restrictions Precautions Precautions: Fall Restrictions Weight Bearing Restrictions: No Other Position/Activity Restrictions: WBAT   Pertinent Vitals/Pain *5/10 L hip Ice applied after PT**    Mobility  Bed Mobility Bed Mobility: Supine to Sit Supine to Sit: 4: Min assist Details for Bed Mobility Assistance: min A for LLE Transfers Transfers: Sit to Stand;Stand to Sit Sit to Stand: 5: Supervision Stand to Sit: 5: Supervision Details for Transfer Assistance: VCs hand placement Ambulation/Gait Ambulation/Gait Assistance: 5: Supervision Ambulation Distance (Feet): 250 Feet Assistive device: Rolling walker Gait Pattern: Step-to pattern General Gait Details: pt steady, no LOB, good sequencing Stairs: Yes Stairs Assistance: 4: Min guard Stair Management Technique: Two rails;Forwards Number of Stairs: 2    Exercises Total Joint Exercises Ankle Circles/Pumps: AROM;10 reps;Supine;Both Quad Sets: AROM;10 reps;Supine;Both Short Arc Quad: AROM;Left;10 reps;Supine Heel Slides: AAROM;Supine;Left;15 reps Hip ABduction/ADduction: AAROM;Supine;Left;15 reps   PT Diagnosis:    PT Problem List:   PT Treatment Interventions:     PT Goals Acute Rehab PT Goals PT Goal  Formulation: With patient Time For Goal Achievement: 08/15/12 Potential to Achieve Goals: Good Pt will go Supine/Side to Sit: with supervision PT Goal: Supine/Side to Sit - Progress: Progressing toward goal Pt will go Sit to Supine/Side: with supervision Pt will go Sit to Stand: with supervision PT Goal: Sit to Stand - Progress: Met Pt will go Stand to Sit: with supervision PT Goal: Stand to Sit - Progress: Met Pt will Ambulate: 51 - 150 feet;with supervision;with rolling walker PT Goal: Ambulate - Progress: Met Pt will Go Up / Down Stairs: 1-2 stairs;with min assist;with least restrictive assistive device PT Goal: Up/Down Stairs - Progress: Met  Visit Information  Last PT Received On: 08/12/12    Subjective Data  Subjective: It feels preety good.  Patient Stated Goal: to work with brother in Social worker Information systems manager)   Cognition  Cognition Overall Cognitive Status: Appears within functional limits for tasks assessed/performed Arousal/Alertness: Awake/alert Orientation Level: Appears intact for tasks assessed Behavior During Session: Sgt. [name] for tasks performed    Balance     End of Session PT - End of Session Equipment Utilized During Treatment: Gait belt Activity Tolerance: Patient tolerated treatment well Patient left: in chair;with call bell/phone within reach Nurse Communication: Mobility status   GP     [name] 08/12/2012, 10:05 AM [phone]

## 2012-08-12 NOTE — Evaluation (Signed)
Occupational Therapy Evaluation Patient Details Name: Lavarr Demont Linford MRN: [id number] DOB: Jul 16, 1950 Today's Date: 08/12/2012 Time: 4098-1191 OT Time Calculation (min): 21 min  OT Assessment / Plan / Recommendation Clinical Impression  This 62 yo male s/p left direct anterior hip replacement presents to acute OT with problems below. WIll benefit from acute OT without need for followup.    OT Assessment  Patient needs continued OT Services    Follow Up Recommendations  No OT follow up    Barriers to Discharge None    Equipment Recommendations  Tub/shower bench;3 in 1 bedside comode       Frequency  Min 2X/week    Precautions / Restrictions Precautions Precautions: Fall Restrictions Weight Bearing Restrictions: No Other Position/Activity Restrictions: WBAT       ADL  Equipment Used: Rolling walker Transfers/Ambulation Related to ADLs: Supervision for all with RW ADL Comments: Pt can use AE for LBB/D with supervision    OT Diagnosis: Generalized weakness  OT Problem List: Decreased knowledge of use of DME or AE OT Treatment Interventions: Balance training;Patient/family education;DME and/or AE instruction   OT Goals Acute Rehab OT Goals OT Goal Formulation: With patient Time For Goal Achievement: 08/19/12 Potential to Achieve Goals: Good ADL Goals Pt Will Perform Tub/Shower Transfer: Tub transfer;with supervision;Ambulation;with DME;Transfer tub bench ADL Goal: Tub/Shower Transfer - Progress: Goal set today  Visit Information  Last OT Received On: 08/12/12 Assistance Needed: +1       Prior Functioning     Home Living Lives With: Significant other Available Help at Discharge: Family (going to stay at sister's house first) Type of Home: House Home Access: Stairs to enter [employer] of Steps: 2 Entrance Stairs-Rails: Right;Left;Can reach both Home Layout: One level Bathroom Shower/Tub: Forensic scientist:  Standard Bathroom Accessibility: Yes How Accessible: Accessible via walker Home Adaptive Equipment: None Prior Function Level of Independence: Independent Able to Take Stairs?: Yes Communication Communication: No difficulties Dominant Hand: Right         Vision/Perception Vision - History Baseline Vision: No visual deficits   Cognition  Cognition Overall Cognitive Status: Appears within functional limits for tasks assessed/performed Arousal/Alertness: Awake/alert Orientation Level: Appears intact for tasks assessed Behavior During Session: [facility] for tasks performed    Extremity/Trunk Assessment Right Upper Extremity Assessment RUE ROM/Strength/Tone: Within functional levels Left Upper Extremity Assessment LUE ROM/Strength/Tone: Within functional levels     Mobility Transfers Transfers: Sit to Stand;Stand to Sit Sit to Stand: 5: Supervision;With upper extremity assist;With armrests;From chair/3-in-1 Stand to Sit: 5: Supervision;With upper extremity assist;With armrests;To chair/3-in-1 Details for Transfer Assistance: VCs hand placement           End of Session OT - End of Session Equipment Utilized During Treatment:  (RW) Activity Tolerance: Patient tolerated treatment well Patient left: in chair;with call bell/phone within reach;with family/visitor present (sister and girlfriend)       Evette Georges [phone] 08/12/2012, 1:03 PM

## 2012-08-12 NOTE — Progress Notes (Signed)
Physical Therapy Treatment Patient Details Name: [name] MRN: [id number] DOB: 11-23-1950 Today's Date: 08/12/2012 Time: 4098-1191 PT Time Calculation (min): 17 min  PT Assessment / Plan / Recommendation Comments on Treatment Session  Pt is progressing well with mobility, he walked 200' with RW today, stair training completed. OK to DC home from PT standpoint.     Follow Up Recommendations  Home health PT     Does the patient have the potential to tolerate intense rehabilitation     Barriers to Discharge        Equipment Recommendations  Rolling walker with 5" wheels    Recommendations for Other Services OT consult  Frequency 7X/week   Plan Discharge plan remains appropriate    Precautions / Restrictions Precautions Precautions: Fall Restrictions Weight Bearing Restrictions: No Other Position/Activity Restrictions: WBAT   Pertinent Vitals/Pain *3/10 L hip Ice applied**    Mobility  Bed Mobility Bed Mobility: Sit to Supine Sit to Supine: 6: Modified independent (Device/Increase time);HOB flat;With rail Transfers Transfers: Sit to Stand;Stand to Sit Sit to Stand: 6: Modified independent (Device/Increase time);From chair/3-in-1;With upper extremity assist Stand to Sit: 6: Modified independent (Device/Increase time);To bed;With upper extremity assist Ambulation/Gait Ambulation/Gait Assistance: 6: Modified independent (Device/Increase time) Ambulation Distance (Feet): 200 Feet Assistive device: Rolling walker Gait Pattern: Step-to pattern General Gait Details: pt steady, no LOB, good sequencing Stairs: No    Exercises Total Joint Exercises Ankle Circles/Pumps: AROM;10 reps;Supine;Both Quad Sets: AROM;10 reps;Supine;Both Short Arc Quad: AROM;Left;10 reps;Supine Heel Slides: AAROM;Supine;Left;15 reps Hip ABduction/ADduction: AAROM;Supine;Left;15 reps   PT Diagnosis:    PT Problem List:   PT Treatment Interventions:     PT Goals Acute Rehab PT Goals PT  Goal Formulation: With patient Time For Goal Achievement: 08/15/12 Potential to Achieve Goals: Good Pt will go Supine/Side to Sit: with supervision Pt will go Sit to Supine/Side: with supervision PT Goal: Sit to Supine/Side - Progress: Met Pt will go Sit to Stand: with modified independence PT Goal: Sit to Stand - Progress: Updated due to goal met Pt will go Stand to Sit: with modified independence PT Goal: Stand to Sit - Progress: Updated due to goals met Pt will Ambulate: 51 - 150 feet;with rolling walker;with modified independence PT Goal: Ambulate - Progress: Updated due to goal met Pt will Go Up / Down Stairs: 1-2 stairs;with min assist;with least restrictive assistive device  Visit Information  Last PT Received On: 08/12/12 Assistance Needed: +1    Subjective Data  Subjective: It feels pretty good.  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

## 2012-08-13 ENCOUNTER — Encounter (HOSPITAL_COMMUNITY): Payer: Self-pay | Admitting: Orthopaedic Surgery

## 2012-08-13 LAB — CBC
HCT: 26.4 % — ABNORMAL LOW (ref 39.0–52.0)
MCHC: 33 g/dL (ref 30.0–36.0)
Platelets: 138 10*3/uL — ABNORMAL LOW (ref 150–400)
RDW: 14.5 % (ref 11.5–15.5)

## 2012-08-13 NOTE — Discharge Summary (Signed)
Patient ID: [name] MRN: [id number] DOB/AGE: 07/01/1950 62 y.o.  Admit date: 08/10/2012 Discharge date: 08/13/2012  Admission Diagnoses:  Principal Problem:   Avascular necrosis of hip   Discharge Diagnoses:  Same  Past Medical History  Diagnosis Date  . Hepatitis     HX   B and C  . HIV disease   . Substance abuse     CRACK COCAINE  07/12/12,  ALCOHOL, CIGARETTE ABUSE  . Tuberculosis     per patient  . Bronchitis, chronic     per office note 06/18/12 Dr  [name]  . Pneumonia     x 2  . Arthritis     left hip  . Condyloma acuminatum     Surgeries: Procedure(s): Left TOTAL HIP ARTHROPLASTY ANTERIOR APPROACH on 08/10/2012   Consultants:    Discharged Condition: Improved  Hospital Course: [name] is an 62 y.o. male who was admitted 08/10/2012 for operative treatment ofAvascular necrosis of hip. Patient has severe unremitting pain that affects sleep, daily activities, and work/hobbies. After pre-op clearance the patient was taken to the operating room on 08/10/2012 and underwent  Procedure(s): Left TOTAL HIP ARTHROPLASTY ANTERIOR APPROACH.    Patient was given perioperative antibiotics: Anti-infectives   Start     Dose/Rate Route Frequency Ordered Stop   08/12/12 1000  doxycycline (VIBRA-TABS) tablet 100 mg     100 mg Oral Every 12 hours 08/12/12 0936     08/12/12 0000  doxycycline (VIBRA-TABS) 100 MG tablet     100 mg Oral Every 12 hours 08/12/12 2216     08/11/12 1000  tenofovir (VIREAD) tablet 300 mg     300 mg Oral  Every morning - 10a 08/10/12 1648     08/10/12 2200  lopinavir-ritonavir (KALETRA) 200-50 MG per tablet 2 tablet     2 tablet Oral 2 times daily 08/10/12 1648     08/10/12 2200  zidovudine (RETROVIR) tablet 300 mg  Status:  Discontinued     300 mg Oral 2 times daily 08/10/12 1648 08/10/12 1723   08/10/12 2200  zidovudine (RETROVIR) capsule 300 mg     300 mg Oral Every 12 hours 08/10/12 1723     08/10/12 2000  ceFAZolin (ANCEF) IVPB 1 g/50  mL premix     1 g 100 mL/hr over 30 Minutes Intravenous Every 6 hours 08/10/12 1648 08/11/12 0237   08/10/12 0847  ceFAZolin (ANCEF) IVPB 2 g/50 mL premix     2 g 100 mL/hr over 30 Minutes Intravenous On call to O.R. 08/10/12 0847 08/10/12 1320       Patient was given sequential compression devices, early ambulation, and chemoprophylaxis to prevent DVT.  Patient benefited maximally from hospital stay and there were no complications.    Recent vital signs: Patient Vitals for the past 24 hrs:  BP Temp Temp src Pulse Resp SpO2  08/13/12 0500 100/60 mmHg 100.2 F (37.9 C) Oral 97 18 95 %  08/12/12 2200 99/63 mmHg 99.1 F (37.3 C) Oral 89 18 93 %  08/12/12 1652 - 97.7 F (36.5 C) Oral - - -  08/12/12 1439 110/72 mmHg 100.3 F (37.9 C) Oral 100 16 96 %  08/12/12 0735 - 98.7 F (37.1 C) Oral - - -     Recent laboratory studies:  Recent Labs  08/11/12 0505 08/12/12 0429 08/13/12 0407  WBC 7.8 11.6* 9.2  HGB 10.7* 10.1* 8.7*  HCT 32.5* 30.7* 26.4*  PLT 132* PLATELET CLUMPS NOTED ON SMEAR, COUNT  APPEARS ADEQUATE 138*  NA 132*  --   --   K 4.2  --   --   CL 98  --   --   CO2 27  --   --   BUN 7  --   --   CREATININE 0.70  --   --   GLUCOSE 118*  --   --   CALCIUM 8.6  --   --      Discharge Medications:     Medication List    STOP taking these medications       HYDROcodone-acetaminophen 7.5-500 MG per tablet  Commonly known as:  LORTAB     traMADol 50 MG tablet  Commonly known as:  ULTRAM      TAKE these medications       aspirin 325 MG EC tablet  Take 1 tablet (325 mg total) by mouth daily.     doxycycline 100 MG tablet  Commonly known as:  VIBRA-TABS  Take 1 tablet (100 mg total) by mouth every 12 (twelve) hours.     Ensure Plus Liqd  Take 237 mLs by mouth 2 (two) times daily as needed. For meal supplement     lopinavir-ritonavir 200-50 MG per tablet  Commonly known as:  KALETRA  Take 2 tablets by mouth 2 (two) times daily.     methocarbamol 500 MG  tablet  Commonly known as:  ROBAXIN  Take 1 tablet (500 mg total) by mouth every 6 (six) hours as needed.     mupirocin nasal ointment 2 %  Commonly known as:  BACTROBAN  Place into the nose 2 (two) times daily. Use one-half of tube in each nostril twice daily for five (5) days. After application, press sides of nose together and gently massage.     oxyCODONE 5 MG immediate release tablet  Commonly known as:  Oxy IR/ROXICODONE  Take 1-2 tablets (5-10 mg total) by mouth every 3 (three) hours as needed.     tenofovir 300 MG tablet  Commonly known as:  VIREAD  Take 300 mg by mouth every morning.     zidovudine 300 MG tablet  Commonly known as:  RETROVIR  Take 300 mg by mouth 2 (two) times daily.        Diagnostic Studies: Dg Hip Complete Left  08/10/2012  *RADIOLOGY REPORT*  Clinical Data: Hip replacement.  LEFT HIP - COMPLETE 2+ VIEW  Comparison: Plain films 06/09/2010.  Findings: We are provided with two-view intraoperative fluoroscopic spot views of the left hip.  Images demonstrate a new left total hip arthroplasty in place.  The device appears located and no fracture is identified.  IMPRESSION: Left hip replacement without evidence of complication.   Original Report Authenticated By: Holley Dexter, M.D.    Dg Pelvis Portable  08/10/2012  *RADIOLOGY REPORT*  Clinical Data: Postop left hip replacement, osteoarthritis  PORTABLE PELVIS  Comparison: 08/10/2012  Findings: Left hip replacement performed.  Components aligned in the frontal plane.  No osseous or hardware abnormality.  Air within the soft tissues from the surgery.  IMPRESSION: Expected appearance status post left hip arthroplasty.   Original Report Authenticated By: Judie Petit. Shick, M.D.    Dg Hip Portable 1 View Left  08/10/2012  *RADIOLOGY REPORT*  Clinical Data: Osteoarthritis, left hip replacement  PORTABLE LEFT HIP - 1 VIEW  Comparison: 08/10/2012  Findings: Cross-table lateral view demonstrates normal alignment. No definite  osseous or hardware abnormality.  Air within the soft tissues from the surgery.  IMPRESSION:  Expected appearance status post left hip arthroplasty.   Original Report Authenticated By: Judie Petit. Shick, M.D.    Dg C-arm 1-60 Min-no Report  08/10/2012  CLINICAL DATA: left total hip   C-ARM 1-60 MINUTES  Fluoroscopy was utilized by the requesting physician.  No radiographic  interpretation.      Disposition: 01-Home or Self Care      Discharge Orders   Future Appointments [facility] Department Dept Phone   09/18/2012 8:45 AM Cliffton Asters, MD [facility] [phone]   Future Orders Complete By Expires     Discharge patient  As directed     Discharge wound care:  As directed     Comments:      Keep dressing clean dry and intact until Wednesday then remove dressing and shower    Weight bearing as tolerated  As directed     Comments:      Weight bearing as tolerated left hip       Follow-up Information   Follow up with Kathryne Hitch, MD. Call in 2 weeks.   Contact information:   [address] [place] [address] [place] [address] [phone]        Signed: Kathryne Hitch 08/13/2012, 7:30 AM

## 2012-08-13 NOTE — Progress Notes (Signed)
Subjective: 3 Days Post-Op Procedure(s) (LRB): Left TOTAL HIP ARTHROPLASTY ANTERIOR APPROACH (Left) Patient reports pain as mild.  Asymptomatic acute blood loss anemia.  Objective: Vital signs in last 24 hours: Temp:  [97.7 F (36.5 C)-100.3 F (37.9 C)] 100.2 F (37.9 C) (03/10 0500) Pulse Rate:  [89-100] 97 (03/10 0500) Resp:  [16-18] 18 (03/10 0500) BP: (99-110)/(60-72) 100/60 mmHg (03/10 0500) SpO2:  [93 %-96 %] 95 % (03/10 0500)  Intake/Output from previous day: 03/09 0701 - 03/10 0700 In: 840 [P.O.:840] Out: 1300 [Urine:1300] Intake/Output this shift:     Recent Labs  08/11/12 0505 08/12/12 0429 08/13/12 0407  HGB 10.7* 10.1* 8.7*    Recent Labs  08/12/12 0429 08/13/12 0407  WBC 11.6* 9.2  RBC 3.03* 2.61*  HCT 30.7* 26.4*  PLT PLATELET CLUMPS NOTED ON SMEAR, COUNT APPEARS ADEQUATE 138*    Recent Labs  08/11/12 0505  NA 132*  K 4.2  CL 98  CO2 27  BUN 7  CREATININE 0.70  GLUCOSE 118*  CALCIUM 8.6   No results found for this basename: LABPT, INR,  in the last 72 hours  Sensation intact distally Intact pulses distally Dorsiflexion/Plantar flexion intact Incision: dressing C/D/I No cellulitis present  Assessment/Plan: 3 Days Post-Op Procedure(s) (LRB): Left TOTAL HIP ARTHROPLASTY ANTERIOR APPROACH (Left) Discharge home with home health  Kathryne Hitch 08/13/2012, 7:28 AM

## 2012-08-13 NOTE — Care Management Note (Addendum)
    Page 1 of 2   08/13/2012     3:22:38 PM   CARE MANAGEMENT NOTE 08/13/2012  Patient:  [name], [name]   Account Number:  [id number]  Date Initiated:  08/12/2012  Documentation initiated by:  [facility]  Subjective/Objective Assessment:   62 year old male admitted s/p left hip replacement.     Action/Plan:   Will return home once medically stable.   Anticipated DC Date:  08/13/2012   Anticipated DC Plan:  HOME W HOME HEALTH SERVICES      DC Planning Services  CM consult      [facility] Choice  HOME HEALTH  DURABLE MEDICAL EQUIPMENT   Choice offered to / List presented to:  C-1 Patient   DME arranged  TUB BENCH  3-N-1  Levan Hurst      DME agency  [facility]     HH arranged  HH-2 PT      [facility] agency  [facility]   Status of service:  Completed, signed off Medicare Important Message given?  NA - LOS <3 / Initial given by admissions (If response is "NO", the following Medicare IM given date fields will be blank) Date Medicare IM given:   Date Additional Medicare IM given:    Discharge Disposition:  HOME W HOME HEALTH SERVICES  Per UR Regulation:  Reviewed for med. necessity/level of care/duration of stay  If discussed at Long Length of Stay Meetings, dates discussed:    Comments:

## 2012-08-13 NOTE — Progress Notes (Signed)
Occupational Therapy Treatment Patient Details Name: [name] MRN: [id number] DOB: 01/15/1951 Today's Date: 08/13/2012 Time: 1308-6578 OT Time Calculation (min): 18 min  OT Assessment / Plan / Recommendation Comments on Treatment Session      Follow Up Recommendations  No OT follow up    Barriers to Discharge       Equipment Recommendations  Tub/shower bench;3 in 1 bedside comode    Recommendations for Other Services    Frequency     Plan      Precautions / Restrictions Precautions Precautions: Fall Restrictions Other Position/Activity Restrictions: WBAT   Pertinent Vitals/Pain 1/10 L hip    ADL  Tub/Shower Transfer: Simulated;Min guard Tub/Shower Transfer Method: Science writer: Transfer tub bench (over garbage can) Transfers/Ambulation Related to ADLs: Pt completed tub bench transfer with min guard. He used arms to help LLE over simulated ledge ADL Comments: reviewed AE and issued kit.  Pt needed min cues for sock aid    OT Diagnosis:    OT Problem List:   OT Treatment Interventions:     OT Goals ADL Goals Pt Will Perform Tub/Shower Transfer: Tub transfer;with supervision;Ambulation;with DME;Transfer tub bench ADL Goal: Tub/Shower Transfer - Progress: Progressing toward goals  Visit Information  Last OT Received On: 08/13/12 Assistance Needed: +1    Subjective Data      Prior Functioning       Cognition  Cognition Overall Cognitive Status: Appears within functional limits for tasks assessed/performed Behavior During Session: [facility] for tasks performed    Mobility       Exercises      Balance     End of Session    GO     SPENCER,MARYELLEN 08/13/2012, 9:27 AM Marica Otter, OTR/L [phone] 08/13/2012

## 2012-08-13 NOTE — Progress Notes (Signed)
Physical Therapy Treatment Patient Details Name: [name] MRN: [id number] DOB: April 04, 1951 Today's Date: 08/13/2012 Time: 4098-1191 PT Time Calculation (min): 28 min  PT Assessment / Plan / Recommendation Comments on Treatment Session  Equipment delivered and in room (3:1, RW, long tub bench).  Assisted pt OOB to amb in hallway then performed 10 steps with B rails at 25% VC's on proper sequencing and safety.  Pt plans to D/C to home later today after his sister gets out of work.    Follow Up Recommendations  Home health PT     Does the patient have the potential to tolerate intense rehabilitation     Barriers to Discharge        Equipment Recommendations       Recommendations for Other Services    Frequency 7X/week   Plan Discharge plan remains appropriate    Precautions / Restrictions Precautions Precautions: Fall Restrictions Weight Bearing Restrictions: No Other Position/Activity Restrictions: WBAT   Pertinent Vitals/Pain C/o "soreness" ICE applied    Mobility  Bed Mobility Bed Mobility: Sit to Supine;Supine to Sit Supine to Sit: 5: Supervision;4: Min guard Sit to Supine: 5: Supervision;5: Set up Details for Bed Mobility Assistance: min guard assist for L LE on/off bed Transfers Transfers: Sit to Stand;Stand to Sit Sit to Stand: 6: Modified independent (Device/Increase time);With upper extremity assist;From bed Stand to Sit: 6: Modified independent (Device/Increase time);To bed;With upper extremity assist Details for Transfer Assistance: 25% VCs hand placement and safety with turns Ambulation/Gait Ambulation/Gait Assistance: 6: Modified independent (Device/Increase time) Ambulation Distance (Feet): 215 Feet Assistive device: Rolling walker Ambulation/Gait Assistance Details: 25% VC's on proper walker to self distance and safety with turns. Gait Pattern: Step-to pattern;Decreased stance time - left;Narrow base of support;Trunk flexed Gait velocity:  WFL Stairs: Yes Stairs Assistance: 4: Min guard Stair Management Technique: Two rails;Forwards Number of Stairs: 10    Exercises Total Joint Exercises Ankle Circles/Pumps: AROM;10 reps;Supine;Both Quad Sets: AROM;10 reps;Supine;Both Short Arc Quad: AROM;Left;10 reps;Supine Heel Slides: AAROM;Supine;Left;15 reps Hip ABduction/ADduction: AAROM;Supine;Left;15 reps    PT Goals                                         progressing    Visit Information  Last PT Received On: 08/13/12 Assistance Needed: +1    Subjective Data      Cognition       Balance   fair+  End of Session PT - End of Session Equipment Utilized During Treatment: Gait belt Activity Tolerance: Patient tolerated treatment well Patient left: with call bell/phone within reach;in bed   Felecia Shelling  PTA [facility]  Acute  Rehab Pager      [phone]

## 2012-08-13 NOTE — Progress Notes (Signed)
Rolling walker, commode and tub bench has been delivered to patients hospital room.

## 2012-09-18 ENCOUNTER — Ambulatory Visit: Payer: Medicaid Other | Admitting: Internal Medicine

## 2013-05-15 DIAGNOSIS — R739 Hyperglycemia, unspecified: Secondary | ICD-10-CM | POA: Insufficient documentation

## 2013-05-15 DIAGNOSIS — D539 Nutritional anemia, unspecified: Secondary | ICD-10-CM | POA: Insufficient documentation

## 2013-05-16 ENCOUNTER — Ambulatory Visit: Payer: Medicaid Other | Admitting: Internal Medicine

## 2013-05-16 ENCOUNTER — Telehealth: Payer: Self-pay | Admitting: *Deleted

## 2013-05-16 NOTE — Telephone Encounter (Signed)
Mailbox is full - unable to leave message.

## 2013-06-12 ENCOUNTER — Encounter: Payer: Self-pay | Admitting: Internal Medicine

## 2013-06-12 ENCOUNTER — Ambulatory Visit (INDEPENDENT_AMBULATORY_CARE_PROVIDER_SITE_OTHER): Payer: Medicaid Other | Admitting: Internal Medicine

## 2013-06-12 VITALS — BP 149/83 | HR 81 | Temp 97.7°F | Ht 71.0 in | Wt 159.5 lb

## 2013-06-12 DIAGNOSIS — B20 Human immunodeficiency virus [HIV] disease: Secondary | ICD-10-CM

## 2013-06-12 LAB — COMPREHENSIVE METABOLIC PANEL
ALT: 137 U/L — AB (ref 0–53)
AST: 133 U/L — ABNORMAL HIGH (ref 0–37)
Albumin: 4.4 g/dL (ref 3.5–5.2)
Alkaline Phosphatase: 131 U/L — ABNORMAL HIGH (ref 39–117)
BILIRUBIN TOTAL: 0.7 mg/dL (ref 0.3–1.2)
BUN: 15 mg/dL (ref 6–23)
CALCIUM: 9.4 mg/dL (ref 8.4–10.5)
CHLORIDE: 107 meq/L (ref 96–112)
CO2: 22 meq/L (ref 19–32)
Creat: 0.95 mg/dL (ref 0.50–1.35)
GLUCOSE: 84 mg/dL (ref 70–99)
Potassium: 4.3 mEq/L (ref 3.5–5.3)
SODIUM: 139 meq/L (ref 135–145)
TOTAL PROTEIN: 8.2 g/dL (ref 6.0–8.3)

## 2013-06-12 LAB — LIPID PANEL
CHOL/HDL RATIO: 2.3 ratio
CHOLESTEROL: 126 mg/dL (ref 0–200)
HDL: 54 mg/dL (ref >39)
LDL Cholesterol: 37 mg/dL (ref 0–99)
Triglycerides: 177 mg/dL — ABNORMAL HIGH (ref <150)
VLDL: 35 mg/dL (ref 0–40)
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
## 2013-06-13 LAB — CBC
HCT: 39.1 % (ref 39.0–52.0)
HEMOGLOBIN: 13.4 g/dL (ref 13.0–17.0)
MCH: 32.5 pg (ref 26.0–34.0)
MCHC: 34.3 g/dL (ref 30.0–36.0)
MCV: 94.9 fL (ref 78.0–100.0)
Platelets: 212 10*3/uL (ref 150–400)
RBC: 4.12 MIL/uL — AB (ref 4.22–5.81)
RDW: 14.6 % (ref 11.5–15.5)
WBC: 8.7 10*3/uL (ref 4.0–10.5)

## 2013-06-13 LAB — RPR

## 2013-06-14 ENCOUNTER — Other Ambulatory Visit: Payer: Self-pay | Admitting: *Deleted

## 2013-06-14 LAB — HIV-1 RNA QUANT-NO REFLEX-BLD: HIV-1 RNA Quant, Log: <1.3 {Log} (ref <1.30)

## 2013-06-14 MED ORDER — ENSURE PLUS PO LIQD: 237.0000 mL | Freq: Two times a day (BID) | ORAL | Status: DC | PRN

## 2013-06-17 ENCOUNTER — Other Ambulatory Visit: Payer: Self-pay | Admitting: *Deleted

## 2013-06-17 DIAGNOSIS — B20 Human immunodeficiency virus [HIV] disease: Secondary | ICD-10-CM

## 2013-06-17 LAB — T-HELPER CELL (CD4) - (RCID CLINIC ONLY)
CD4 T CELL HELPER: 15 % — AB (ref 33–55)
CD4 T Cell Abs: 740 /uL (ref 400–2700)

## 2013-06-17 MED ORDER — ZIDOVUDINE 300 MG PO TABS: 300.0000 mg | ORAL_TABLET | Freq: Two times a day (BID) | ORAL | Status: DC

## 2013-06-17 MED ORDER — LOPINAVIR-RITONAVIR 200-50 MG PO TABS: 2.0000 | ORAL_TABLET | Freq: Two times a day (BID) | ORAL | Status: DC

## 2013-06-17 MED ORDER — TENOFOVIR DISOPROXIL FUMARATE 300 MG PO TABS: 300.0000 mg | ORAL_TABLET | Freq: Every morning | ORAL | Status: DC

## 2013-06-17 NOTE — Telephone Encounter (Signed)
ADAP application 

## 2013-07-01 ENCOUNTER — Other Ambulatory Visit: Payer: Self-pay | Admitting: *Deleted

## 2013-07-01 DIAGNOSIS — B20 Human immunodeficiency virus [HIV] disease: Secondary | ICD-10-CM

## 2013-07-01 MED ORDER — TENOFOVIR DISOPROXIL FUMARATE 300 MG PO TABS: 300.0000 mg | ORAL_TABLET | Freq: Every morning | ORAL | Status: DC

## 2013-07-01 MED ORDER — LOPINAVIR-RITONAVIR 200-50 MG PO TABS: 2.0000 | ORAL_TABLET | Freq: Two times a day (BID) | ORAL | Status: DC

## 2013-07-01 MED ORDER — ZIDOVUDINE 300 MG PO TABS: 300.0000 mg | ORAL_TABLET | Freq: Two times a day (BID) | ORAL | Status: DC

## 2013-07-09 ENCOUNTER — Other Ambulatory Visit: Payer: Self-pay | Admitting: Licensed Clinical Social Worker

## 2013-07-09 DIAGNOSIS — B20 Human immunodeficiency virus [HIV] disease: Secondary | ICD-10-CM

## 2013-07-09 MED ORDER — ZIDOVUDINE 300 MG PO TABS: 300.0000 mg | ORAL_TABLET | Freq: Two times a day (BID) | ORAL | Status: DC

## 2013-07-09 MED ORDER — TENOFOVIR DISOPROXIL FUMARATE 300 MG PO TABS: 300.0000 mg | ORAL_TABLET | Freq: Every morning | ORAL | Status: DC

## 2013-07-09 MED ORDER — LOPINAVIR-RITONAVIR 200-50 MG PO TABS: 2.0000 | ORAL_TABLET | Freq: Two times a day (BID) | ORAL | Status: DC

## 2013-07-15 ENCOUNTER — Other Ambulatory Visit: Payer: Self-pay | Admitting: *Deleted

## 2013-07-15 DIAGNOSIS — B20 Human immunodeficiency virus [HIV] disease: Secondary | ICD-10-CM

## 2013-07-15 MED ORDER — TENOFOVIR DISOPROXIL FUMARATE 300 MG PO TABS: 300.0000 mg | ORAL_TABLET | Freq: Every morning | ORAL | Status: DC

## 2013-07-15 MED ORDER — ZIDOVUDINE 300 MG PO TABS: 300.0000 mg | ORAL_TABLET | Freq: Two times a day (BID) | ORAL | Status: DC

## 2013-07-15 MED ORDER — LOPINAVIR-RITONAVIR 200-50 MG PO TABS: 2.0000 | ORAL_TABLET | Freq: Two times a day (BID) | ORAL | Status: DC

## 2013-08-09 ENCOUNTER — Other Ambulatory Visit: Payer: Self-pay | Admitting: Licensed Clinical Social Worker

## 2013-08-09 DIAGNOSIS — B20 Human immunodeficiency virus [HIV] disease: Secondary | ICD-10-CM

## 2013-08-09 MED ORDER — TENOFOVIR DISOPROXIL FUMARATE 300 MG PO TABS: 300.0000 mg | ORAL_TABLET | Freq: Every morning | ORAL | Status: DC

## 2013-08-09 MED ORDER — ZIDOVUDINE 300 MG PO TABS: 300.0000 mg | ORAL_TABLET | Freq: Two times a day (BID) | ORAL | Status: DC

## 2013-08-09 MED ORDER — LOPINAVIR-RITONAVIR 200-50 MG PO TABS: 2.0000 | ORAL_TABLET | Freq: Two times a day (BID) | ORAL | Status: DC

## 2013-08-27 ENCOUNTER — Encounter: Payer: Self-pay | Admitting: Internal Medicine

## 2013-10-09 ENCOUNTER — Other Ambulatory Visit: Payer: Self-pay

## 2013-10-23 ENCOUNTER — Ambulatory Visit (INDEPENDENT_AMBULATORY_CARE_PROVIDER_SITE_OTHER): Payer: Medicaid Other | Admitting: Internal Medicine

## 2013-10-23 ENCOUNTER — Encounter: Payer: Self-pay | Admitting: Internal Medicine

## 2013-10-23 VITALS — BP 131/78 | HR 71 | Temp 98.5°F | Wt 154.0 lb

## 2013-10-23 DIAGNOSIS — B20 Human immunodeficiency virus [HIV] disease: Secondary | ICD-10-CM

## 2013-10-23 LAB — COMPREHENSIVE METABOLIC PANEL
ALBUMIN: 4 g/dL (ref 3.5–5.2)
ALT: 76 U/L — AB (ref 0–53)
AST: 94 U/L — ABNORMAL HIGH (ref 0–37)
Alkaline Phosphatase: 109 U/L (ref 39–117)
BILIRUBIN TOTAL: 0.8 mg/dL (ref 0.2–1.2)
BUN: 13 mg/dL (ref 6–23)
CO2: 24 meq/L (ref 19–32)
Calcium: 9.3 mg/dL (ref 8.4–10.5)
Chloride: 107 mEq/L (ref 96–112)
Creat: 0.96 mg/dL (ref 0.50–1.35)
Glucose, Bld: 104 mg/dL — ABNORMAL HIGH (ref 70–99)
POTASSIUM: 5.5 meq/L — AB (ref 3.5–5.3)
Sodium: 139 mEq/L (ref 135–145)
Total Protein: 7.1 g/dL (ref 6.0–8.3)

## 2013-10-23 NOTE — Progress Notes (Signed)
Patient ID: [name], male   DOB: Oct 17, 1950, 63 y.o.   MRN: [id number]          Patient Active Problem List   Diagnosis Date Noted  . HIV DISEASE 07/03/2006    Priority: High  . HEPATITIS C 07/03/2006    Priority: High  . Macrocytic anemia 05/15/2013    Priority: Medium  . Hyperglycemia 05/15/2013    Priority: Medium  . CIGARETTE SMOKER 07/03/2006    Priority: Medium  . Avascular necrosis of hip 07/20/2012  . Chronic bronchitis 06/18/2012  . HIP FRACTURE, LEFT 06/15/2010  . ATYPICAL MYCOBACTERIAL INFECTION 12/04/2008  . TENDINITIS, THUMB 05/20/2008  . Condyloma Acuminatum 07/03/2006  . ERECTILE DYSFUNCTION 07/03/2006  . SUBSTANCE ABUSE, MULTIPLE 07/03/2006  . HX, PERSONAL, TUBERCULOSIS 07/03/2006  . HEPATITIS B, HX OF 07/03/2006    Patient's Medications  New Prescriptions   No medications on file  Previous Medications   ASPIRIN EC 325 MG EC TABLET    Take 1 tablet (325 mg total) by mouth daily.   DOXYCYCLINE (VIBRA-TABS) 100 MG TABLET    Take 1 tablet (100 mg total) by mouth every 12 (twelve) hours.   ENSURE PLUS (ENSURE PLUS) LIQD    Take 237 mLs by mouth 2 (two) times daily as needed. For meal supplement   LOPINAVIR-RITONAVIR (KALETRA) 200-50 MG PER TABLET    Take 2 tablets by mouth 2 (two) times daily.   METHOCARBAMOL (ROBAXIN) 500 MG TABLET    Take 1 tablet (500 mg total) by mouth every 6 (six) hours as needed.   MUPIROCIN NASAL OINTMENT (BACTROBAN) 2 %    Place into the nose 2 (two) times daily. Use one-half of tube in each nostril twice daily for five (5) days. After application, press sides of nose together and gently massage.   OXYCODONE (OXY IR/ROXICODONE) 5 MG IMMEDIATE RELEASE TABLET    Take 1-2 tablets (5-10 mg total) by mouth every 3 (three) hours as needed.   TENOFOVIR (VIREAD) 300 MG TABLET    Take 1 tablet (300 mg total) by mouth every morning.   ZIDOVUDINE (RETROVIR) 300 MG TABLET    Take 1 tablet (300 mg total) by mouth 2 (two) times daily.    Modified Medications   No medications on file  Discontinued Medications   No medications on file    Subjective: [name] is in for his routine visit. He is in much better spirits than his last visit in January. He states that he's had some time to adjust to the sudden death of his long-time partner. He is still living with his sister and finds that to be a very good situation. She helps him with his medications. However he states that he has been missing his evening doses on a fairly frequent basis recently. He estimates that he is in the 7-8 evening doses in the past month. He uses a pillbox but comes home late some days and falls asleep before taking his evening meds.  Review of Systems: Pertinent items are noted in HPI.  Past Medical History  Diagnosis Date  . Hepatitis     HX   B and C  . HIV disease   . Substance abuse     CRACK COCAINE  07/12/12,  ALCOHOL, CIGARETTE ABUSE  . Tuberculosis     per patient  . Bronchitis, chronic     per office note 06/18/12 Dr  Orvan Falconerampbell  . Pneumonia     x 2  . Arthritis  left hip  . Condyloma acuminatum     History  Substance Use Topics  . Smoking status: Current Every Day Smoker -- 0.50 packs/day for 50 years    Types: Cigarettes  . Smokeless tobacco: Never Used  . Alcohol Use: Yes     Comment: 3-4, 40oz beers /day    No family history on file.  No Known Allergies  Objective: Temp: 98.5 F (36.9 C) (05/20 1011) Temp src: Oral (05/20 1011) BP: 131/78 mmHg (05/20 1011) Pulse Rate: 71 (05/20 1011) Body mass index is 21.49 kg/(m^2).  General: He is in good spirits Oral: No oropharyngeal lesions. Many missing and carious teeth Skin: No rash Lungs: Clear Cor: Regular S1 and S2 no murmurs  Lab Results Lab Results  Component Value Date   WBC 8.7 06/12/2013   HGB 13.4 06/12/2013   HCT 39.1 06/12/2013   MCV 94.9 06/12/2013   PLT 212 06/12/2013    Lab Results  Component Value Date   CREATININE 0.95 06/12/2013   BUN 15 06/12/2013   NA  139 06/12/2013   K 4.3 06/12/2013   CL 107 06/12/2013   CO2 22 06/12/2013    Lab Results  Component Value Date   ALT 137* 06/12/2013   AST 133* 06/12/2013   ALKPHOS 131* 06/12/2013   BILITOT 0.7 06/12/2013    Lab Results  Component Value Date   CHOL 126 06/12/2013   HDL 54 06/12/2013   LDLCALC 37 06/12/2013   TRIG 177* 06/12/2013   CHOLHDL 2.3 06/12/2013    Lab Results HIV 1 RNA Quant (copies/mL)  Date Value  06/12/2013 <20   08/07/2012 <20   04/18/2011 29*     CD4 T Cell Abs (/uL)  Date Value  06/12/2013 740   08/07/2012 580   04/18/2011 820      Assessment: I am concerned about him missing doses. I will recheck lab work today and see him back in 2 weeks. I asked him to set an alarm clock to remind him to take his evening dose of medications and also asked her sister to help remind him.  Plan: 1. Continue current antiretroviral regimen for now 2. Check lab work today 3. Followup in 2 weeks 4. Dental clinic referral   William AstersJohn Samanyu Tinnell, MD [facility] [phone] pager   [phone] cell 10/23/2013, 10:31 AM

## 2013-10-24 LAB — T-HELPER CELL (CD4) - (RCID CLINIC ONLY)
CD4 % Helper T Cell: 16 % — ABNORMAL LOW (ref 33–55)
CD4 T Cell Abs: 490 /uL (ref 400–2700)

## 2013-10-24 LAB — HIV-1 RNA QUANT-NO REFLEX-BLD: HIV 1 RNA Quant: <20 copies/mL (ref <20)

## 2013-11-04 ENCOUNTER — Telehealth: Payer: Self-pay | Admitting: *Deleted

## 2013-11-04 NOTE — Telephone Encounter (Signed)
Patient called upset because he can not get Ensure from [facility] and wants to know how to get it. Advised him because of a shortage we can only give it to the most underweight patient and he does not qualify. Patient advised he has always gotten it and does not understand why things have changed and that he is underweight. Advised the patient we can revisit the issue at his next visit but as of today he does not qualify.

## 2013-11-05 ENCOUNTER — Encounter: Payer: Self-pay | Admitting: Internal Medicine

## 2013-11-05 ENCOUNTER — Ambulatory Visit (INDEPENDENT_AMBULATORY_CARE_PROVIDER_SITE_OTHER): Payer: Medicaid Other | Admitting: Internal Medicine

## 2013-11-05 VITALS — BP 130/85 | HR 76 | Temp 98.4°F | Wt 157.2 lb

## 2013-11-05 DIAGNOSIS — B20 Human immunodeficiency virus [HIV] disease: Secondary | ICD-10-CM

## 2013-11-05 NOTE — Progress Notes (Signed)
Patient ID: [name], male   DOB: June 10, 1950, 63 y.o.   MRN: [id number]          Patient Active Problem List   Diagnosis Date Noted  . HIV DISEASE 07/03/2006    Priority: High  . HEPATITIS C 07/03/2006    Priority: High  . Macrocytic anemia 05/15/2013    Priority: Medium  . Hyperglycemia 05/15/2013    Priority: Medium  . CIGARETTE SMOKER 07/03/2006    Priority: Medium  . Avascular necrosis of hip 07/20/2012  . Chronic bronchitis 06/18/2012  . HIP FRACTURE, LEFT 06/15/2010  . ATYPICAL MYCOBACTERIAL INFECTION 12/04/2008  . TENDINITIS, THUMB 05/20/2008  . Condyloma Acuminatum 07/03/2006  . ERECTILE DYSFUNCTION 07/03/2006  . SUBSTANCE ABUSE, MULTIPLE 07/03/2006  . HX, PERSONAL, TUBERCULOSIS 07/03/2006  . HEPATITIS B, HX OF 07/03/2006    Patient's Medications  New Prescriptions   No medications on file  Previous Medications   LOPINAVIR-RITONAVIR (KALETRA) 200-50 MG PER TABLET    Take 2 tablets by mouth 2 (two) times daily.   TENOFOVIR (VIREAD) 300 MG TABLET    Take 1 tablet (300 mg total) by mouth every morning.   ZIDOVUDINE (RETROVIR) 300 MG TABLET    Take 1 tablet (300 mg total) by mouth 2 (two) times daily.  Modified Medications   No medications on file  Discontinued Medications   ASPIRIN EC 325 MG EC TABLET    Take 1 tablet (325 mg total) by mouth daily.   DOXYCYCLINE (VIBRA-TABS) 100 MG TABLET    Take 1 tablet (100 mg total) by mouth every 12 (twelve) hours.   ENSURE PLUS (ENSURE PLUS) LIQD    Take 237 mLs by mouth 2 (two) times daily as needed. For meal supplement   METHOCARBAMOL (ROBAXIN) 500 MG TABLET    Take 1 tablet (500 mg total) by mouth every 6 (six) hours as needed.   MUPIROCIN NASAL OINTMENT (BACTROBAN) 2 %    Place into the nose 2 (two) times daily. Use one-half of tube in each nostril twice daily for five (5) days. After application, press sides of nose together and gently massage.   OXYCODONE (OXY IR/ROXICODONE) 5 MG IMMEDIATE RELEASE TABLET    Take  1-2 tablets (5-10 mg total) by mouth every 3 (three) hours as needed.    Subjective: [name] is in with his sister, [name], for his followup visit. After his visit several weeks ago he started using his pill box again and has not missed any doses of his Retrovir, Viread and Kaletra. He has his medications with him today and describes taking them correctly.  Review of Systems: Pertinent items are noted in HPI.  Past Medical History  Diagnosis Date  . Hepatitis     HX   B and C  . HIV disease   . Substance abuse     CRACK COCAINE  07/12/12,  ALCOHOL, CIGARETTE ABUSE  . Tuberculosis     per patient  . Bronchitis, chronic     per office note 06/18/12 Dr  Megan Salon  . Pneumonia     x 2  . Arthritis     left hip  . Condyloma acuminatum     History  Substance Use Topics  . Smoking status: Current Every Day Smoker -- 0.50 packs/day for 50 years    Types: Cigarettes  . Smokeless tobacco: Never Used  . Alcohol Use: Yes     Comment: 3-4, 40oz beers /day    No family history on file.  No Known  Allergies  Objective: Temp: 98.4 F (36.9 C) (06/02 0833) Temp src: Oral (06/02 0833) BP: 130/85 mmHg (06/02 0833) Pulse Rate: 76 (06/02 0833) Body mass index is 21.94 kg/(m^2).  General: He is in good spirits Oral: No oropharyngeal lesions Skin: No rash Lungs: Clear Cor: Regular S1 and S2 no murmurs  Lab Results Lab Results  Component Value Date   WBC 8.7 06/12/2013   HGB 13.4 06/12/2013   HCT 39.1 06/12/2013   MCV 94.9 06/12/2013   PLT 212 06/12/2013    Lab Results  Component Value Date   CREATININE 0.96 10/23/2013   BUN 13 10/23/2013   NA 139 10/23/2013   K 5.5* 10/23/2013   CL 107 10/23/2013   CO2 24 10/23/2013    Lab Results  Component Value Date   ALT 76* 10/23/2013   AST 94* 10/23/2013   ALKPHOS 109 10/23/2013   BILITOT 0.8 10/23/2013    Lab Results  Component Value Date   CHOL 126 06/12/2013   HDL 54 06/12/2013   LDLCALC 37 06/12/2013   TRIG 177* 06/12/2013   CHOLHDL 2.3 06/12/2013     Lab Results HIV 1 RNA Quant (copies/mL)  Date Value  10/23/2013 <20   06/12/2013 <20   08/07/2012 <20      CD4 T Cell Abs (/uL)  Date Value  10/23/2013 490   06/12/2013 740   08/07/2012 580      Assessment: Fortunately his current antiretroviral regimen is still working very well. I will check an HLA B5701 to see if he can tolerate Ziagen and consider simplifying his regimen to Ziagen, Viread and Prezcobix.  Plan: 1. Continue current regimen for now 2. Check HLA B5701 3. Followup in 4 weeks   Michel Bickers, MD [facility] [phone] pager   [phone] cell 11/05/2013, 8:48 AM

## 2013-11-11 LAB — HLA B*5701: HLA-B*5701 w/rflx HLA-B High: NEGATIVE

## 2013-12-03 ENCOUNTER — Other Ambulatory Visit: Payer: Self-pay | Admitting: *Deleted

## 2013-12-03 ENCOUNTER — Ambulatory Visit (INDEPENDENT_AMBULATORY_CARE_PROVIDER_SITE_OTHER): Payer: Medicaid Other | Admitting: Internal Medicine

## 2013-12-03 VITALS — BP 116/78 | HR 79 | Temp 98.3°F | Wt 146.8 lb

## 2013-12-03 DIAGNOSIS — B20 Human immunodeficiency virus [HIV] disease: Secondary | ICD-10-CM

## 2013-12-03 MED ORDER — ABACAVIR SULFATE 300 MG PO TABS: 600.0000 mg | ORAL_TABLET | Freq: Every day | ORAL | Status: DC

## 2013-12-03 MED ORDER — TENOFOVIR DISOPROXIL FUMARATE 300 MG PO TABS: 300.0000 mg | ORAL_TABLET | Freq: Every morning | ORAL | Status: DC

## 2013-12-03 MED ORDER — DARUNAVIR-COBICISTAT 800-150 MG PO TABS: 1.0000 | ORAL_TABLET | Freq: Every day | ORAL | Status: DC

## 2013-12-03 NOTE — Progress Notes (Signed)
Patient ID: [name], male   DOB: 1951-03-23, 63 y.o.   MRN: [id number] HPI: [name] is a 63 y.o. male who is here for his follow up of his HIV  Allergies: No Known Allergies  Vitals: Temp: 98.3 F (36.8 C) (06/30 0843) Temp src: Oral (06/30 0843) BP: 116/78 mmHg (06/30 0843) Pulse Rate: 79 (06/30 0843)  Past Medical History: Past Medical History  Diagnosis Date  . Hepatitis     HX   B and C  . HIV disease   . Substance abuse     CRACK COCAINE  07/12/12,  ALCOHOL, CIGARETTE ABUSE  . Tuberculosis     per patient  . Bronchitis, chronic     per office note 06/18/12 Dr  Orvan Falconerampbell  . Pneumonia     x 2  . Arthritis     left hip  . Condyloma acuminatum     Social History: History   Social History  . Marital Status: Married    Spouse Name: N/A    Number of Children: N/A  . Years of Education: N/A   Social History Main Topics  . Smoking status: Current Every Day Smoker -- 0.50 packs/day for 50 years    Types: Cigarettes  . Smokeless tobacco: Never Used  . Alcohol Use: Yes     Comment: 3-4, 40oz beers /day  . Drug Use: Yes    Special: "Crack" cocaine     Comment: crack  twice month- last dose 2 weeks ago  . Sexual Activity: Not Currently     Comment: given condoms   Other Topics Concern  . Not on file   Social History Narrative  . No narrative on file    Previous Regimen:   Current Regimen: KLT + TDF + AZT  Labs: HIV 1 RNA Quant (copies/mL)  Date Value  10/23/2013 <20   06/12/2013 <20   08/07/2012 <20      CD4 T Cell Abs (/uL)  Date Value  10/23/2013 490   06/12/2013 740   08/07/2012 580      Hep B S Ab (no units)  Date Value  07/31/2006 Yes      HCV Ab (no units)  Date Value  07/31/2006 Yes     CrCl: The CrCl is unknown because both a height and weight (above a minimum accepted value) are required for this calculation.  Lipids:    Component Value Date/Time   CHOL 126 06/12/2013 1639   TRIG 177* 06/12/2013 1639   HDL 54 06/12/2013  1639   CHOLHDL 2.3 06/12/2013 1639   VLDL 35 06/12/2013 1639   LDLCALC 37 06/12/2013 1639    Assessment: He is doing well on his current regimen with the help of his sister. We are simplifying the regimen for him today. He still does have trouble understanding and said that he needs his sister's help. We're trying to make is easier for both of them. Switching to Prezcobix + TDF + ABC. Explained on how to take it and make sure that his sister cont to help him and he said that it will be the case. Made a med calendar for him.   Recommendations: Stop AZT/KLT Start Prezcobix/ABC/TDF  Clide CliffPham, Tashona Calk [name], PharmD Clinical Infectious Disease Pharmacist [facility] 12/03/2013, 9:08 AM

## 2013-12-03 NOTE — Progress Notes (Signed)
Patient ID: [name], male   DOB: [birth year], 63 y.o.   MRN: [id number]          Patient Active Problem List   Diagnosis Date Noted  . HIV DISEASE 07/03/2006    Priority: High  . HEPATITIS C 07/03/2006    Priority: High  . Macrocytic anemia 05/15/2013    Priority: Medium  . Hyperglycemia 05/15/2013    Priority: Medium  . CIGARETTE SMOKER 07/03/2006    Priority: Medium  . Avascular necrosis of hip 07/20/2012  . Chronic bronchitis 06/18/2012  . HIP FRACTURE, LEFT 06/15/2010  . ATYPICAL MYCOBACTERIAL INFECTION 12/04/2008  . TENDINITIS, THUMB 05/20/2008  . Condyloma Acuminatum 07/03/2006  . ERECTILE DYSFUNCTION 07/03/2006  . SUBSTANCE ABUSE, MULTIPLE 07/03/2006  . HX, PERSONAL, TUBERCULOSIS 07/03/2006  . HEPATITIS B, HX OF 07/03/2006    Patient's Medications  New Prescriptions   ABACAVIR (ZIAGEN) 300 MG TABLET    Take 2 tablets (600 mg total) by mouth daily.   DARUNAVIR-COBICISTAT (PREZCOBIX) 800-150 MG PER TABLET    Take 1 tablet by mouth daily. Swallow whole. Do NOT crush, break or chew tablets. Take with food.  Previous Medications   TENOFOVIR (VIREAD) 300 MG TABLET    Take 1 tablet (300 mg total) by mouth every morning.  Modified Medications   No medications on file  Discontinued Medications   LOPINAVIR-RITONAVIR (KALETRA) 200-50 MG PER TABLET    Take 2 tablets by mouth 2 (two) times daily.   ZIDOVUDINE (RETROVIR) 300 MG TABLET    Take 1 tablet (300 mg total) by mouth 2 (two) times daily.    Subjective: [name] is in for his routine visit. The sister [name] feels that his pillbox. Review of Systems: Pertinent items are noted in HPI.  Past Medical History  Diagnosis Date  . Hepatitis     HX   B and C  . HIV disease   . Substance abuse     CRACK COCAINE  07/12/12,  ALCOHOL, CIGARETTE ABUSE  . Tuberculosis     per patient  . Bronchitis, chronic     per office note 06/18/12 Dr  Orvan Falconerampbell  . Pneumonia     x 2  . Arthritis     left hip  . Condyloma  acuminatum     History  Substance Use Topics  . Smoking status: Current Every Day Smoker -- 0.50 packs/day for 50 years    Types: Cigarettes  . Smokeless tobacco: Never Used  . Alcohol Use: Yes     Comment: 3-4, 40oz beers /day    No family history on file.  No Known Allergies  Objective: Temp: 98.3 F (36.8 C) (06/30 0843) Temp src: Oral (06/30 0843) BP: 116/78 mmHg (06/30 0843) Pulse Rate: 79 (06/30 0843) Body mass index is 20.48 kg/(m^2).  General: In no distress Oral: No oropharyngeal lesions. Missing teeth Lungs: Clear Cor: reg S1 and S2   Lab Results Lab Results  Component Value Date   WBC 8.7 06/12/2013   HGB 13.4 06/12/2013   HCT 39.1 06/12/2013   MCV 94.9 06/12/2013   PLT 212 06/12/2013    Lab Results  Component Value Date   CREATININE 0.96 10/23/2013   BUN 13 10/23/2013   NA 139 10/23/2013   K 5.5* 10/23/2013   CL 107 10/23/2013   CO2 24 10/23/2013    Lab Results  Component Value Date   ALT 76* 10/23/2013   AST 94* 10/23/2013   ALKPHOS 109 10/23/2013   BILITOT 0.8 10/23/2013  Lab Results  Component Value Date   CHOL 126 06/12/2013   HDL 54 06/12/2013   LDLCALC 37 06/12/2013   TRIG 177* 06/12/2013   CHOLHDL 2.3 06/12/2013    Lab Results HIV 1 RNA Quant (copies/mL)  Date Value  10/23/2013 <20   06/12/2013 <20   08/07/2012 <20      CD4 T Cell Abs (/uL)  Date Value  10/23/2013 490   06/12/2013 740   08/07/2012 580      Assessment: Also profile his regimen to Ziagen, Viread and Prezcobix.  Plan: 1. Simplified once daily antiretroviral regimen. 2. Followup in 6 weeks   Cliffton AstersJohn Campbell, MD [facility] [phone] pager   [phone] cell 12/03/2013, 8:54 AM

## 2014-01-14 ENCOUNTER — Encounter: Payer: Self-pay | Admitting: Internal Medicine

## 2014-01-14 ENCOUNTER — Ambulatory Visit (INDEPENDENT_AMBULATORY_CARE_PROVIDER_SITE_OTHER): Payer: Medicaid Other | Admitting: Internal Medicine

## 2014-01-14 ENCOUNTER — Other Ambulatory Visit: Payer: Self-pay | Admitting: *Deleted

## 2014-01-14 VITALS — BP 120/79 | HR 78 | Temp 98.7°F | Ht 71.0 in | Wt 155.8 lb

## 2014-01-14 DIAGNOSIS — B20 Human immunodeficiency virus [HIV] disease: Secondary | ICD-10-CM
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
## 2014-01-14 NOTE — Telephone Encounter (Signed)
Pt has refills on Viread.  Lost the bottle so he didn't know the prescription number.   Office will call in for a refill for the pt.

## 2014-01-28 ENCOUNTER — Other Ambulatory Visit: Payer: Self-pay | Admitting: Licensed Clinical Social Worker

## 2014-01-28 DIAGNOSIS — B20 Human immunodeficiency virus [HIV] disease: Secondary | ICD-10-CM

## 2014-01-28 MED ORDER — ABACAVIR SULFATE 300 MG PO TABS: 600.0000 mg | ORAL_TABLET | Freq: Every day | ORAL | Status: DC

## 2014-04-16 ENCOUNTER — Other Ambulatory Visit: Payer: Medicaid Other

## 2014-04-17 ENCOUNTER — Other Ambulatory Visit: Payer: Medicaid Other

## 2014-04-17 DIAGNOSIS — B20 Human immunodeficiency virus [HIV] disease: Secondary | ICD-10-CM

## 2014-04-18 LAB — HIV-1 RNA QUANT-NO REFLEX-BLD

## 2014-04-18 LAB — T-HELPER CELL (CD4) - (RCID CLINIC ONLY)
CD4 T CELL ABS: 480 /uL (ref 400–2700)
CD4 T CELL HELPER: 15 % — AB (ref 33–55)

## 2014-04-22 ENCOUNTER — Emergency Department (HOSPITAL_COMMUNITY)
Admission: EM | Admit: 2014-04-22 | Discharge: 2014-04-22 | Discharge status: Against Medical Advice | Payer: Medicaid Other | Attending: Emergency Medicine | Admitting: Emergency Medicine

## 2014-04-22 DIAGNOSIS — S0531XA Ocular laceration without prolapse or loss of intraocular tissue, right eye, initial encounter: Secondary | ICD-10-CM | POA: Insufficient documentation

## 2014-04-22 DIAGNOSIS — S01411A Laceration without foreign body of right cheek and temporomandibular area, initial encounter: Secondary | ICD-10-CM | POA: Insufficient documentation

## 2014-04-22 DIAGNOSIS — Z21 Asymptomatic human immunodeficiency virus [HIV] infection status: Secondary | ICD-10-CM | POA: Insufficient documentation

## 2014-04-22 DIAGNOSIS — Y9289 Other specified places as the place of occurrence of the external cause: Secondary | ICD-10-CM | POA: Insufficient documentation

## 2014-04-22 DIAGNOSIS — Y9389 Activity, other specified: Secondary | ICD-10-CM | POA: Insufficient documentation

## 2014-04-22 DIAGNOSIS — Y998 Other external cause status: Secondary | ICD-10-CM | POA: Insufficient documentation

## 2014-04-22 DIAGNOSIS — Z720 Tobacco use: Secondary | ICD-10-CM | POA: Diagnosis not present

## 2014-04-22 NOTE — ED Notes (Signed)
rn went into room pt reported he wanted to go home. Pt did not mention anything about wanting to kill anyone. [employer] at bedside with pt. [employer] offered to take pt home.
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
## 2014-05-06 ENCOUNTER — Encounter: Payer: Self-pay | Admitting: Internal Medicine

## 2014-05-06 ENCOUNTER — Ambulatory Visit (INDEPENDENT_AMBULATORY_CARE_PROVIDER_SITE_OTHER): Payer: Medicaid Other | Admitting: Internal Medicine

## 2014-05-06 ENCOUNTER — Ambulatory Visit (INDEPENDENT_AMBULATORY_CARE_PROVIDER_SITE_OTHER): Payer: Medicaid Other | Admitting: *Deleted

## 2014-05-06 DIAGNOSIS — B20 Human immunodeficiency virus [HIV] disease: Secondary | ICD-10-CM

## 2014-05-06 DIAGNOSIS — Z23 Encounter for immunization: Secondary | ICD-10-CM
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
## 2014-08-15 ENCOUNTER — Other Ambulatory Visit: Payer: Self-pay | Admitting: *Deleted

## 2014-08-15 DIAGNOSIS — B20 Human immunodeficiency virus [HIV] disease: Secondary | ICD-10-CM

## 2014-08-15 MED ORDER — ABACAVIR SULFATE 300 MG PO TABS: 600.0000 mg | ORAL_TABLET | Freq: Every day | ORAL | Status: DC

## 2014-08-15 MED ORDER — TENOFOVIR DISOPROXIL FUMARATE 300 MG PO TABS: 300.0000 mg | ORAL_TABLET | Freq: Every morning | ORAL | Status: DC

## 2014-08-15 MED ORDER — DARUNAVIR-COBICISTAT 800-150 MG PO TABS: 1.0000 | ORAL_TABLET | Freq: Every day | ORAL | Status: DC

## 2014-10-28 ENCOUNTER — Telehealth: Payer: Self-pay | Admitting: *Deleted

## 2014-10-28 NOTE — Telephone Encounter (Signed)
PA needed for Prezcobix rx.  [facility].  May take 48-72 hours for a response.

## 2014-11-05 ENCOUNTER — Other Ambulatory Visit: Payer: Self-pay

## 2014-11-05 DIAGNOSIS — R197 Diarrhea, unspecified: Secondary | ICD-10-CM

## 2014-11-05 DIAGNOSIS — B20 Human immunodeficiency virus [HIV] disease: Secondary | ICD-10-CM

## 2014-11-05 LAB — LIPID PANEL
CHOL/HDL RATIO: 2.6 ratio
Cholesterol: 133 mg/dL (ref 0–200)
HDL: 52 mg/dL (ref >=40)
LDL CALC: 68 mg/dL (ref 0–99)
Triglycerides: 63 mg/dL (ref <150)
VLDL: 13 mg/dL (ref 0–40)

## 2014-11-05 LAB — COMPREHENSIVE METABOLIC PANEL
ALT: 43 U/L (ref 0–53)
AST: 98 U/L — ABNORMAL HIGH (ref 0–37)
Albumin: 3.5 g/dL (ref 3.5–5.2)
Alkaline Phosphatase: 88 U/L (ref 39–117)
BUN: 12 mg/dL (ref 6–23)
CALCIUM: 8.9 mg/dL (ref 8.4–10.5)
CO2: 21 mEq/L (ref 19–32)
CREATININE: 1.21 mg/dL (ref 0.50–1.35)
Chloride: 106 mEq/L (ref 96–112)
GLUCOSE: 98 mg/dL (ref 70–99)
Potassium: 4.8 mEq/L (ref 3.5–5.3)
SODIUM: 139 meq/L (ref 135–145)
Total Bilirubin: 0.4 mg/dL (ref 0.2–1.2)
Total Protein: 7.1 g/dL (ref 6.0–8.3)

## 2014-11-05 LAB — CBC
HCT: 37.3 % — ABNORMAL LOW (ref 39.0–52.0)
HEMOGLOBIN: 12.4 g/dL — AB (ref 13.0–17.0)
MCH: 30.5 pg (ref 26.0–34.0)
MCHC: 33.2 g/dL (ref 30.0–36.0)
MCV: 91.6 fL (ref 78.0–100.0)
MPV: 10 fL (ref 8.6–12.4)
Platelets: 185 10*3/uL (ref 150–400)
RBC: 4.07 MIL/uL — ABNORMAL LOW (ref 4.22–5.81)
RDW: 15.1 % (ref 11.5–15.5)
WBC: 6.5 10*3/uL (ref 4.0–10.5)

## 2014-11-05 LAB — RPR

## 2014-11-05 NOTE — Telephone Encounter (Signed)
Phone call to pharmacy.  Refill has been completed.

## 2014-11-06 LAB — T-HELPER CELL (CD4) - (RCID CLINIC ONLY)
CD4 % Helper T Cell: 8 % — ABNORMAL LOW (ref 33–55)
CD4 T Cell Abs: 310 /uL — ABNORMAL LOW (ref 400–2700)

## 2014-11-07 LAB — HIV-1 RNA QUANT-NO REFLEX-BLD
HIV 1 RNA Quant: 38394 copies/mL — ABNORMAL HIGH (ref <20)
HIV-1 RNA Quant, Log: 4.58 {Log} — ABNORMAL HIGH (ref <1.30)

## 2014-11-11 ENCOUNTER — Other Ambulatory Visit: Payer: Self-pay | Admitting: Internal Medicine

## 2014-11-11 DIAGNOSIS — B20 Human immunodeficiency virus [HIV] disease: Secondary | ICD-10-CM

## 2014-11-18 ENCOUNTER — Ambulatory Visit (INDEPENDENT_AMBULATORY_CARE_PROVIDER_SITE_OTHER): Payer: Self-pay | Admitting: Internal Medicine

## 2014-11-18 ENCOUNTER — Telehealth: Payer: Self-pay | Admitting: *Deleted

## 2014-11-18 ENCOUNTER — Encounter: Payer: Self-pay | Admitting: Internal Medicine

## 2014-11-18 DIAGNOSIS — B20 Human immunodeficiency virus [HIV] disease: Secondary | ICD-10-CM

## 2014-11-18 NOTE — Telephone Encounter (Signed)
RN noted new referral for community based health care nurse to assist with medication management/adherence. Contacted the patient at home number listed. VM left asking the patient to return my call. RN would like to schedule a date and time for home visit. RN is available as early as today if the patient is interested

## 2014-11-18 NOTE — Progress Notes (Signed)
Patient ID: [name], male   DOB: 05-05-1951, 63 y.o.   MRN: [id number]          Patient Active Problem List   Diagnosis Date Noted  . Human immunodeficiency virus (HIV) disease 07/03/2006    Priority: High  . HEPATITIS C 07/03/2006    Priority: High  . Macrocytic anemia 05/15/2013    Priority: Medium  . Hyperglycemia 05/15/2013    Priority: Medium  . CIGARETTE SMOKER 07/03/2006    Priority: Medium  . Avascular necrosis of hip 07/20/2012  . Chronic bronchitis 06/18/2012  . HIP FRACTURE, LEFT 06/15/2010  . ATYPICAL MYCOBACTERIAL INFECTION 12/04/2008  . TENDINITIS, THUMB 05/20/2008  . Condyloma acuminatum 07/03/2006  . ERECTILE DYSFUNCTION 07/03/2006  . SUBSTANCE ABUSE, MULTIPLE 07/03/2006  . HX, PERSONAL, TUBERCULOSIS 07/03/2006  . HEPATITIS B, HX OF 07/03/2006    Patient's Medications  New Prescriptions   No medications on file  Previous Medications   ABACAVIR (ZIAGEN) 300 MG TABLET    Take 2 tablets (600 mg total) by mouth daily. Cancel Zidovudine please   DARUNAVIR-COBICISTAT (PREZCOBIX) 800-150 MG PER TABLET    Take 1 tablet by mouth daily. Swallow whole. Do NOT crush, break or chew tablets. Take with food.   TENOFOVIR (VIREAD) 300 MG TABLET    Take 1 tablet (300 mg total) by mouth every morning.  Modified Medications   No medications on file  Discontinued Medications   No medications on file    Subjective: [name] is in with his sister for his routine HIV follow-up visit. He ran out of all of his HIV medicines 3 months ago and was only able to get started back 1 week ago. It is unclear to me why he could not get his medications. He is unsure. He is now taking his Ziagen, Viread and Prezcobix every morning with breakfast. His sister oversees his medications. They state that he has not missed doses when he had his medications to take. He is still smoking cigarettes and drinking alcohol 1-2 times each week. He has been bothered by some slightly pruritic lumps on  his arms recently.  Review of Systems: Constitutional: negative Eyes: negative Ears, nose, mouth, throat, and face: negative Respiratory: negative Cardiovascular: negative Gastrointestinal: negative Genitourinary:positive for slight suprapubic discomfort, negative for dysuria, frequency, hematuria and hesitancy  Past Medical History  Diagnosis Date  . Hepatitis     HX   B and C  . HIV disease   . Substance abuse     CRACK COCAINE  07/12/12,  ALCOHOL, CIGARETTE ABUSE  . Tuberculosis     per patient  . Bronchitis, chronic     per office note 06/18/12 Dr  [name]  . Pneumonia     x 2  . Arthritis     left hip  . Condyloma acuminatum     History  Substance Use Topics  . Smoking status: Current Some Day Smoker -- 0.20 packs/day for 50 years    Types: Cigarettes  . Smokeless tobacco: Never Used  . Alcohol Use: 0.0 oz/week    0 Standard drinks or equivalent per week     Comment: 2, 40oz beers /week    No family history on file.  No Known Allergies  Objective: Temp: 98.9 F (37.2 C) (06/14 0848) Temp Source: Oral (06/14 0848) BP: 132/77 mmHg (06/14 0848) Pulse Rate: 67 (06/14 0848) Body mass index is 21.21 kg/(m^2).  General: His weight is unchanged at 152 pounds Oral: Some missing teeth. No oropharyngeal lesions  Skin: Few excoriated papules on forearms Lungs: Clear Cor: Regular S1 and S2 with no murmurs Abdomen: Obese, soft and nontender Joints and extremities: Normal Mood: Normal. He does not appear depressed or anxious  Lab Results Lab Results  Component Value Date   WBC 6.5 11/05/2014   HGB 12.4* 11/05/2014   HCT 37.3* 11/05/2014   MCV 91.6 11/05/2014   PLT 185 11/05/2014    Lab Results  Component Value Date   CREATININE 1.21 11/05/2014   BUN 12 11/05/2014   NA 139 11/05/2014   K 4.8 11/05/2014   CL 106 11/05/2014   CO2 21 11/05/2014    Lab Results  Component Value Date   ALT 43 11/05/2014   AST 98* 11/05/2014   ALKPHOS 88 11/05/2014    BILITOT 0.4 11/05/2014    Lab Results  Component Value Date   CHOL 133 11/05/2014   HDL 52 11/05/2014   LDLCALC 68 11/05/2014   TRIG 63 11/05/2014   CHOLHDL 2.6 11/05/2014    Lab Results HIV 1 RNA QUANT (copies/mL)  Date Value  11/05/2014 38394*  04/17/2014 <20  10/23/2013 <20   CD4 T CELL ABS (/uL)  Date Value  11/05/2014 310*  04/17/2014 480  10/23/2013 490     Assessment: He has had viral activation after running out of his medications. He is now back taking his medications correctly. I will repeat lab work in 6 weeks.  I have encouraged him to consider quitting cigarettes completely. He smokes very infrequently. He mentions that he thinks that he probably smokes because he believes that it makes him look cool and that it might help him get a date with a younger girl.   He is still drinking alcohol intermittently but it does not appear that he is drinking to excess or that it is causing him any significant problems.  He has very nonspecific papular skin lesions. This may be related to reactivation of his HIV infection.  Plan: 1. Continue current anti-retroviral regimen 2. Follow-up after blood work in 6 weeks 3. Encouraged abstinence from cigarettes and alcohol 4. Follow-up here in 2 months   Cliffton Asters, MD [facility] [phone] pager   [phone] cell 11/18/2014, 9:03 AM

## 2014-11-20 ENCOUNTER — Ambulatory Visit: Payer: Self-pay | Admitting: *Deleted

## 2014-11-20 VITALS — BP 118/72 | HR 86 | Temp 98.1°F | Resp 18

## 2014-11-20 DIAGNOSIS — B20 Human immunodeficiency virus [HIV] disease: Secondary | ICD-10-CM

## 2014-11-24 ENCOUNTER — Encounter: Payer: Self-pay | Admitting: *Deleted

## 2014-11-24 NOTE — Patient Instructions (Addendum)
RN met with client and caregiver(Barbara) for assessment. RN reviewed Transport planner, [place], Home Safety Management Information Booklet. Home Fire Safety Assessment, Fall Risk Assessment and Suicide Risk Assessment was performed. RN also discussed information on a Living Will, Advanced Directives, and [place]. RN and Client/Designated Party educated/reviewed/signed Client Agreement and Consent for Service form along with Patient Rights and Responsibilities statement. RN developed patient specific and centered care plan. RN provided contact information and reviewed how to receive emergency help after hours for schedule changes, reporting of safety issues, falls, concerns or any needs/questions. Standard Precaution and Infection control along with interventions to correct or prevent high risk behaviors instructed to the patient. Pt stated during my assessment that he has occasional diarrhea after eating poke salad. Pt states he likes to eat it regularly but Rn encouraged the patient to limit his intake of Poke salad since it can be harmful if not prepared correctly. Pt agreed stating he must be cooked right and only certain people in his family can cook it.  Client/Caregiver reports understanding and agreement with the above. During today's visit the patient stated he continues to have difficulty with managing his medications because he likes to drink daily and spends time with a group of friends that help him spend his money but do not offer helpful support. RN stated to the patient that my focus is to increase his support system along with provide education on how to live a great life with HIV. Pt stated usually when people give him information on HIV he throws it away because he does not want to hear about it. Pt then asked what does that CD4 mean?  RN educated the patient and visually showed the patient his current CD4 and viral load. Plan is to see patient  next week to f/u and continue education.

## 2014-11-24 NOTE — Progress Notes (Signed)
Order received on 11/18/2014 by Dr.John Cambell/Denise Lennox Laity RN to evaluate patient for [facility] Services [facility]).  Patient was evaluated on 11/20/2014 for [facility].  Patient was consented to care at this time.    Frequency / Duration of [facility] visits: 1w10, 3PRN's for complications with disease process/progression, medication changes or concerns   [facility] will assess for learning needs related to diagnosis and treatment regimen, provide education as needed, RN may fill pillbox is assessed to be needed by the RN, and communicate with care team including physician and case managers,and bridge counselors.  Individualized  Plan Of Care Certification Period: 11/20/2014-02/18/2015 a. Type of service(s) and care to be delivered: RN [facility] Based Health Care Nurse) b. Frequency and duration of service:1w10, 3PRN's for complications with disease process/progression, medication changes or concerns  c. Activity restrictions: None Noted d. Safety Measures; Standard Precautions, Infection Control e. Service Objectives and Goals: Pt Centered Goal is to get better and continue to approve. Current RN goal is to educate the patient on living with HIV and the disease process. Focus will be around medication adherence and compliance by understanding the process and creating new consistent habits. RN will also assess for the need to include substance abuse counseling to address the patient's depressive feeling and substance use.  f. Equipment required: No additional Equipment needs at this time g. Functional Limitations:No noted functional limitations h. Rehabilitation potential: Guarded i. Diet and Nutritional Needs; regular diet j. Medications and treatments: Medications have been reconciled/reviewed and are a part of this [employer] k. Specific therapies if needed: RN l. Pertinent diagnoses: HIV Disease. Hep B&C, Substance Abuse m. Expected outcome: Guarded based on the  patient's motivation

## 2014-11-27 ENCOUNTER — Telehealth: Payer: Self-pay | Admitting: *Deleted

## 2014-11-27 ENCOUNTER — Other Ambulatory Visit: Payer: Self-pay | Admitting: *Deleted

## 2014-11-28 ENCOUNTER — Other Ambulatory Visit: Payer: Self-pay | Admitting: *Deleted

## 2014-11-28 ENCOUNTER — Telehealth: Payer: Self-pay | Admitting: *Deleted

## 2014-11-28 NOTE — Telephone Encounter (Signed)
Contacted the patient to arrange/confirm visit time for today. RN did not receive a answer. Message left stating the reason for my call and that I will look forward to hearing from the patient

## 2014-11-28 NOTE — Telephone Encounter (Signed)
RN spoke with [name] and canceled today's visit due to a unexpected emergency. [name] verbalized a understanding and stated we can reschedule for tomorrow or next week. Agreed to the plan and will contact the patient tomorrow for a visit

## 2014-12-02 ENCOUNTER — Telehealth: Payer: Self-pay | Admitting: *Deleted

## 2014-12-02 NOTE — Telephone Encounter (Signed)
Contacted the patient and spoke with his sister(Brenda Mat Carnelay). Rn asked if we could arrange a time for our visit this week. Mrs. Mat CarneClay stated the patient was just expecting us to continue on Thursday. RN agreed with that day with plans to see the patient on Thursday 06/30.

## 2014-12-04 ENCOUNTER — Ambulatory Visit: Payer: Self-pay | Admitting: *Deleted

## 2014-12-04 VITALS — BP 118/70 | HR 80 | Temp 98.4°F | Resp 16

## 2014-12-04 DIAGNOSIS — B20 Human immunodeficiency virus [HIV] disease: Secondary | ICD-10-CM

## 2014-12-04 NOTE — Patient Instructions (Signed)
Please refer to progress not for education provided 

## 2014-12-04 NOTE — Progress Notes (Signed)
Patient ID: [name], male   DOB: July 15, 1950, 64 y.o.   MRN: [id number] RN visit conducted today. Current RN goal is to educate the patient on living with HIV and the disease process. Focus will be around medication adherence and compliance by understanding the process and creating new consistent habits.  Pt states he is feeling well but continues to have a itch over his entire body. Pt does not have a visible rash but RN questioned the patient on if his Liver function has ever been tested. RN educated the patient that sometimes you may have itching of the skin if your ammonia level is elevated. Instructed the patient and his sister that the liver removes the ammonia from our body but if the liver is not working properly it may not remove enough of the ammonia. RN reviewed the chart and noted that the pt's last ammonia level was normal in 2014. RN instructed the patient that since he probably will continue to be a occasional drinker he should ask Dr Orvan Falconerampbell about treatment for his Hep C. The Goal is to remove as much strain on the liver as possible. Instructed the patient and his sister that we know have a cure for Hep C with a 97% success rate. Pt verbalized understanding of education provided. Pt states he has some occasional pain in his left hip along with a couple of falls. This is the same hip that the patient had necrosis and a total hip replacement on. My concern at this time is that the pt's history of necrosis may have loosened the hardware and weakened the hip. I recommended that the patient see Dr Magnus IvanBlackman since he has not seen him since the Hip replacement was done in 2012. RN questioned the patient on his insurance. Right now the patient states he only gets his SSI check (733) and the additional benefits for being a widower. Pt states he gets about 1100 a month but does not have insurance. RN instructed the patient that I will work on finding resources to assist him with insurance or benefits  to cover the needed MD appts that do not relate to HIV. RN instructed the patient and his sister that he must renew his [employer] benefits in July. Clarified with the sister that it must be done within July (like starting tomorrow). Ended the visit with the patient at this time.  RN conferenced with THP( Amy and Aundra MilletMegan) about William [name]' needs. They were familiar with him and can assist with financial concerns.  Contacted [name] and spoke with his sister and scheduled a intake appt with the patient and THP(Amy) for tomorrow at 11am.  Aundra MilletMegan will began to Case Manager [name] again after the intake is completed.

## 2014-12-11 ENCOUNTER — Ambulatory Visit: Payer: Self-pay | Admitting: *Deleted

## 2014-12-11 VITALS — BP 114/70 | HR 85 | Temp 97.6°F | Resp 18 | Wt 150.8 lb

## 2014-12-11 DIAGNOSIS — B20 Human immunodeficiency virus [HIV] disease: Secondary | ICD-10-CM

## 2014-12-11 DIAGNOSIS — R197 Diarrhea, unspecified: Secondary | ICD-10-CM

## 2014-12-11 NOTE — Progress Notes (Signed)
Patient ID: [name], male   DOB: 29-Dec-1950, 64 y.o.   MRN: [id number] RN conducted home visit today. The patient appeared jovial but states he has been thinking about the death of his long term girlfriend frequently. RN offered the assistance of a counselor to allow time to sit and take about her death. The patient states what bothers him a lot is that she passed 2 days before he was released from prison. Pt declined the offer to speak was a counselor because he states he hates to think about it or talk about it.   Last week the patient [facility] and they are working on the ppk for a [employer](Insurance Card). The goal is for the patient to get a [employer] Card and then see Dr Magnus IvanBlackman for assessment of his left hip. Pt has c/o pain discomfort along with unexplained falls and the inability to stand for a while after falling. Pt's hip has not been assessed since his surgery in 2012.  During the nursing assessment the patient states he continues to have diarrhea and at times has to stop while walking up the street to have a bowel movement. RN weighed the patient and explained to the patient my concern for him becoming dehydrated. The patient has several factors that increases his risk of dehydration such as diarrhea, increased sweating from sitting outside, and increased alcohol consumption.  RN offered to request a order for Lomotil to treat his diarrhea. Pt agreed to have the medication requested.  The focus of this visit was to ensure the patient is staying in care with community resources, adhering to medication regimen and seeking resource to address his Holistic Health. Advised the patient of his next appt time and dates for Labs/visit with Dr Orvan Falconerampbell. Plan is to see the patient next week Thursday to f/u on his diarrhea and medication adherence

## 2014-12-17 DIAGNOSIS — R197 Diarrhea, unspecified: Secondary | ICD-10-CM | POA: Insufficient documentation

## 2014-12-17 NOTE — Addendum Note (Signed)
Addended by: Cliffton AstersAMPBELL, Kharon Hixon on: 12/17/2014 10:04 AM   Modules accepted: Orders

## 2014-12-18 ENCOUNTER — Ambulatory Visit: Payer: Self-pay | Admitting: *Deleted

## 2014-12-18 VITALS — BP 128/70 | HR 80 | Temp 97.6°F | Resp 18

## 2014-12-18 DIAGNOSIS — R197 Diarrhea, unspecified: Secondary | ICD-10-CM

## 2014-12-18 DIAGNOSIS — B20 Human immunodeficiency virus [HIV] disease: Secondary | ICD-10-CM

## 2014-12-22 ENCOUNTER — Telehealth: Payer: Self-pay | Admitting: *Deleted

## 2014-12-22 ENCOUNTER — Other Ambulatory Visit: Payer: Self-pay | Admitting: *Deleted

## 2014-12-22 ENCOUNTER — Other Ambulatory Visit: Payer: Self-pay | Admitting: Internal Medicine

## 2014-12-22 DIAGNOSIS — B20 Human immunodeficiency virus [HIV] disease: Secondary | ICD-10-CM

## 2014-12-22 MED ORDER — ABACAVIR SULFATE 300 MG PO TABS: 600.0000 mg | ORAL_TABLET | Freq: Every day | ORAL | Status: DC

## 2014-12-22 MED ORDER — DARUNAVIR-COBICISTAT 800-150 MG PO TABS: 1.0000 | ORAL_TABLET | Freq: Every day | ORAL | Status: DC

## 2014-12-22 MED ORDER — TENOFOVIR DISOPROXIL FUMARATE 300 MG PO TABS: 300.0000 mg | ORAL_TABLET | Freq: Every morning | ORAL | Status: DC

## 2014-12-22 NOTE — Telephone Encounter (Signed)
Received a call from Ms. [name] stating the HIV medications need a prior authorization from the MD before the pharmacy will refill. Mrs. [name] also stated [name] has not done the stool specimen yet but she will let me know once he does.  RN communicated with William HampshireMichelle Howell RN in reference to the refills. Refill request has been submitted. RN returned a call to [name] and left her a message stating the refill request has been sent to the pharmacy and to allow about a hour for the medications to be refilled.

## 2014-12-24 NOTE — Progress Notes (Signed)
Patient ID: [name], male   DOB: [birth year], 64 y.o.   MRN: [id number] RN visit conducted today. During our last visit the patient stated he has episodes of diarrhea. RN relayed the information to Dr Megan Salon who order a stool specimen. RN traveled to the clinic and picked up a stool collection kit. Currently the RN educated the patient and his sister, brenda Lissa Hoard, on how to collect a stool sample properly. Ms. Lissa Hoard stated she would contact me once the specimen is collected. Pt stated his is continuing to take his medication daily and cannot recall of a day that he missed. Pt states he has a routine in the am and puts his medications in his pocket to be sure he does not forgets to take them with food. RN instructed the patient that it may be a good idea to increase his fiber and add bulk to his diet. Rn instructed the pt on high fiber foods such as apples, pears(with skin), raisins, oranges, peas etc. Pt stated he just went and picked some peas this Am and is looking forward to having some

## 2014-12-25 ENCOUNTER — Other Ambulatory Visit (INDEPENDENT_AMBULATORY_CARE_PROVIDER_SITE_OTHER): Payer: Self-pay | Admitting: *Deleted

## 2014-12-25 DIAGNOSIS — A319 Mycobacterial infection, unspecified: Secondary | ICD-10-CM

## 2014-12-26 LAB — CLOSTRIDIUM DIFFICILE BY PCR: Toxigenic C. Difficile by PCR: NOT DETECTED

## 2014-12-30 ENCOUNTER — Other Ambulatory Visit: Payer: Self-pay

## 2014-12-31 ENCOUNTER — Other Ambulatory Visit (INDEPENDENT_AMBULATORY_CARE_PROVIDER_SITE_OTHER): Payer: Self-pay

## 2014-12-31 ENCOUNTER — Telehealth: Payer: Self-pay | Admitting: *Deleted

## 2014-12-31 DIAGNOSIS — B20 Human immunodeficiency virus [HIV] disease: Secondary | ICD-10-CM

## 2014-12-31 DIAGNOSIS — R197 Diarrhea, unspecified: Secondary | ICD-10-CM

## 2014-12-31 LAB — CBC
HEMATOCRIT: 41.4 % (ref 39.0–52.0)
HEMOGLOBIN: 13.6 g/dL (ref 13.0–17.0)
MCH: 29.4 pg (ref 26.0–34.0)
MCHC: 32.9 g/dL (ref 30.0–36.0)
MCV: 89.6 fL (ref 78.0–100.0)
MPV: 10.2 fL (ref 8.6–12.4)
PLATELETS: 166 10*3/uL (ref 150–400)
RBC: 4.62 MIL/uL (ref 4.22–5.81)
RDW: 14.5 % (ref 11.5–15.5)
WBC: 6 10*3/uL (ref 4.0–10.5)

## 2014-12-31 LAB — COMPREHENSIVE METABOLIC PANEL
ALT: 70 U/L — AB (ref 9–46)
AST: 99 U/L — ABNORMAL HIGH (ref 10–35)
Albumin: 4.2 g/dL (ref 3.6–5.1)
Alkaline Phosphatase: 85 U/L (ref 40–115)
BUN: 15 mg/dL (ref 7–25)
CHLORIDE: 106 meq/L (ref 98–110)
CO2: 24 meq/L (ref 20–31)
Calcium: 9.9 mg/dL (ref 8.6–10.3)
Creat: 1.09 mg/dL (ref 0.70–1.25)
Glucose, Bld: 87 mg/dL (ref 65–99)
POTASSIUM: 4.7 meq/L (ref 3.5–5.3)
SODIUM: 141 meq/L (ref 135–146)
TOTAL PROTEIN: 8 g/dL (ref 6.1–8.1)
Total Bilirubin: 0.5 mg/dL (ref 0.2–1.2)

## 2014-12-31 MED ORDER — LOMOTIL 2.5-0.025 MG PO TABS: ORAL_TABLET | ORAL | Status: DC

## 2014-12-31 NOTE — Addendum Note (Signed)
Addended by: Lind Covert L on: 12/31/2014 09:06 AM   Modules accepted: Orders

## 2014-12-31 NOTE — Telephone Encounter (Signed)
Rn contacted the patient to make him aware that I have called in a prescription for Lomotil to help treat his diarrhea. RN instructed the patient that it should be ready after his appt at the clinic for lab work. Pt acknowledged a understanding of information given and will pick his script up from [facility]

## 2014-12-31 NOTE — Telephone Encounter (Addendum)
RN contacted the patient and spoke with his sister Lowella Dell) RN informed Mrs. Mat Carne that the Mr. Mercedes missed his lab appt yesterday. Mrs. Mat Carne stated she was aware and before she could get hold of him he was out the door.  RN offered to reschedule the pt's lab appt for today to ensure the labs are in place before his appt with Dr. [name] on 08/09. Mrs. Mat Carne said that would work fine for her and we rescheduled his appt for today at 9:30am. RN also filled pt's script for lomotil. Per Dr [name] pt may have lomotil since lab results show no signs of C-Diff infection

## 2015-01-01 ENCOUNTER — Ambulatory Visit: Payer: Self-pay | Admitting: *Deleted

## 2015-01-01 VITALS — BP 120/70 | HR 80 | Temp 98.3°F | Resp 18

## 2015-01-01 DIAGNOSIS — M25552 Pain in left hip: Secondary | ICD-10-CM

## 2015-01-01 DIAGNOSIS — B20 Human immunodeficiency virus [HIV] disease: Secondary | ICD-10-CM

## 2015-01-01 DIAGNOSIS — G8929 Other chronic pain: Secondary | ICD-10-CM

## 2015-01-01 LAB — HIV-1 RNA QUANT-NO REFLEX-BLD
HIV 1 RNA QUANT: 94 {copies}/mL — AB (ref <20)
HIV-1 RNA QUANT, LOG: 1.97 {Log} — AB (ref <1.30)

## 2015-01-01 LAB — T-HELPER CELL (CD4) - (RCID CLINIC ONLY)
CD4 T CELL HELPER: 16 % — AB (ref 33–55)
CD4 T Cell Abs: 590 /uL (ref 400–2700)

## 2015-01-02 NOTE — Progress Notes (Signed)
Patient ID: [name], male   DOB: 1950/12/24, 64 y.o.   MRN: [id number] RN visit conducted today and pt stated he is feeling better. Pt states he received his new script and today is her first day trying it. Pt states he has been taking his medication daily without any problems. RN questioned the patient on if he can remember a day that he missed his medications. Pt says no because he takes it every day. RN questioned the patient on the status of his "[employer]" with [facility]. Pt stated it has been processed and will be arriving this week. [name] states he continues to have trouble with his left hip. RN contacted Dr Eliberto Ivory office and made them aware that the pt continues to have trouble with his left hip. Advised that he had a left hip replacement done by Dr Magnus Ivan and was noted to have avascular necrosis after the hip replacement. Pt was given a appt for Aug 7th at 10am for evaluation of his left hip. Verified the office location for the patient.

## 2015-01-06 LAB — HIV-1 GENOTYPR PLUS

## 2015-01-08 ENCOUNTER — Ambulatory Visit: Payer: Self-pay | Admitting: *Deleted

## 2015-01-08 DIAGNOSIS — F1410 Cocaine abuse, uncomplicated: Secondary | ICD-10-CM

## 2015-01-08 DIAGNOSIS — B20 Human immunodeficiency virus [HIV] disease: Secondary | ICD-10-CM

## 2015-01-13 ENCOUNTER — Ambulatory Visit: Payer: Self-pay

## 2015-01-13 ENCOUNTER — Encounter: Payer: Self-pay | Admitting: Internal Medicine

## 2015-01-13 ENCOUNTER — Ambulatory Visit (INDEPENDENT_AMBULATORY_CARE_PROVIDER_SITE_OTHER): Payer: Self-pay | Admitting: Internal Medicine

## 2015-01-13 DIAGNOSIS — B20 Human immunodeficiency virus [HIV] disease: Secondary | ICD-10-CM

## 2015-01-13 NOTE — Progress Notes (Signed)
Patient Active Problem List   Diagnosis Date Noted  . Human immunodeficiency virus (HIV) disease 07/03/2006    Priority: High  . HEPATITIS C 07/03/2006    Priority: High  . Macrocytic anemia 05/15/2013    Priority: Medium  . Hyperglycemia 05/15/2013    Priority: Medium  . CIGARETTE SMOKER 07/03/2006    Priority: Medium  . Diarrhea 12/17/2014  . Avascular necrosis of hip 07/20/2012  . Chronic bronchitis 06/18/2012  . HIP FRACTURE, LEFT 06/15/2010  . ATYPICAL MYCOBACTERIAL INFECTION 12/04/2008  . TENDINITIS, THUMB 05/20/2008  . Condyloma acuminatum 07/03/2006  . ERECTILE DYSFUNCTION 07/03/2006  . SUBSTANCE ABUSE, MULTIPLE 07/03/2006  . HX, PERSONAL, TUBERCULOSIS 07/03/2006  . HEPATITIS B, HX OF 07/03/2006    Patient's Medications  New Prescriptions   No medications on file  Previous Medications   ABACAVIR (ZIAGEN) 300 MG TABLET    Take 2 tablets (600 mg total) by mouth daily. Cancel Zidovudine please   DARUNAVIR-COBICISTAT (PREZCOBIX) 800-150 MG PER TABLET    Take 1 tablet by mouth daily. Swallow whole. Do NOT crush, break or chew tablets. Take with food.   LOMOTIL 2.5-0.025 MG PER TABLET    1 to 2 tablets by mouth 4 times a day PRN for diarrhea. Not to exceed 8 tablets per day   TENOFOVIR (VIREAD) 300 MG TABLET    Take 1 tablet (300 mg total) by mouth every morning.  Modified Medications   No medications on file  Discontinued Medications   No medications on file    Subjective: [name] is in for his routine HIV follow-up visit. He is accompanied by his sister today. She continues to oversee his anti-retroviral therapy. He developed some diarrhea after restarting therapy 2 months ago but it is now well controlled with intermittent use of Lomotil. He states that he is feeling better with the exception of some numbness and tingling in his right foot. It is not painful.   Review of Systems: Pertinent items are noted in HPI.  Past Medical History  Diagnosis Date    . Hepatitis     HX   B and C  . HIV disease   . Tuberculosis     per patient  . Bronchitis, chronic     per office note 06/18/12 Dr  [name]  . Pneumonia     x 2  . Arthritis     left hip  . Condyloma acuminatum   . Substance abuse     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,  ALCOHOL, CIGARETTE ABUSE    History  Substance Use Topics  . Smoking status: Current Some Day Smoker -- 0.20 packs/day for 50 years    Types: Cigarettes  . Smokeless tobacco: Never Used  . Alcohol Use: 25.2 oz/week    42 Standard drinks or equivalent per week     Comment: 6-12oz beers    No family history on file.  No Known Allergies  Objective:  Filed Vitals:   01/13/15 0912  BP: 124/82  Pulse: 77  Temp: 98.7 F (37.1 C)  TempSrc: Oral  Weight: 144 lb 4 oz (65.431 kg)   Body mass index is 20.13 kg/(m^2).  General: He is in good spirits Lungs: Clear Cor: Regular S1 and S2 with no murmurs Feet: Warm and well perfused  Lab Results Lab Results  Component Value Date   WBC 6.0 12/31/2014   HGB 13.6 12/31/2014   HCT 41.4 12/31/2014   MCV 89.6 12/31/2014  PLT 166 12/31/2014    Lab Results  Component Value Date   CREATININE 1.09 12/31/2014   BUN 15 12/31/2014   NA 141 12/31/2014   K 4.7 12/31/2014   CL 106 12/31/2014   CO2 24 12/31/2014    Lab Results  Component Value Date   ALT 70* 12/31/2014   AST 99* 12/31/2014   ALKPHOS 85 12/31/2014   BILITOT 0.5 12/31/2014    Lab Results  Component Value Date   CHOL 133 11/05/2014   HDL 52 11/05/2014   LDLCALC 68 11/05/2014   TRIG 63 11/05/2014   CHOLHDL 2.6 11/05/2014    Lab Results HIV 1 RNA QUANT (copies/mL)  Date Value  12/31/2014 94*  11/05/2014 38394*  04/17/2014 <20   CD4 T CELL ABS (/uL)  Date Value  12/31/2014 590  11/05/2014 310*  04/17/2014 480     Problem List Items Addressed This Visit      High   Human immunodeficiency virus (HIV) disease    His HIV infection has come back under good control and his CD4 count has  reconstituted to normal after restarting anti-retroviral therapy. I will continue his current regimen and have him follow-up in 3 months.      Relevant Orders   CBC   Comprehensive metabolic panel   T-helper cell (CD4)- ([facility] clinic only)   HIV 1 RNA quant-no reflex-bld        Cliffton Asters, MD [facility] [phone] pager   [phone] cell 01/13/2015, 9:35 AM

## 2015-01-13 NOTE — Assessment & Plan Note (Signed)
His HIV infection has come back under good control and his CD4 count has reconstituted to normal after restarting anti-retroviral therapy. I will continue his current regimen and have him follow-up in 3 months.

## 2015-01-14 LAB — HIV-1 INTEGRASE GENOTYPE

## 2015-01-14 NOTE — Progress Notes (Signed)
Patient ID: [name], male   DOB: July 18, 1950, 64 y.o.   MRN: [id number] RN arrived at the pt's home during our prearranged time of 9am. The pt's sister meet me at the front door to say "[name] knew you were coming but felt like the streets was more important". [name](pt's sister) states she cannot deal with his drug use and coming in the house all hours of the night. [name] states she has to focus on her own health and cannot continue to neglect herself. [name] states she wants [name] to leave her home and find another place to leave. She has not spoken with him yet because he has preferred to be on the streets smoking crack for the last 2 days. RN verbalized a understand and stated  I would speak with [facility](Megan) about possible housing for the patient.    RN traveled to [employer] and meet with Megan(Mr. Judea' Case Manager), advised [name] that the patient may be homeless soon due to the sisters concern with his drug use and lack of appreciation. [name] stated [name] left her a voicemail this morning stating he got messed up last night and needed to cancel his appt with me and with [name] assess his hip). Megan verbalized a understanding that housing may be another barrier for this patient

## 2015-01-14 NOTE — Patient Instructions (Signed)
Please refer to progress note for education provided this visit 

## 2015-01-22 ENCOUNTER — Ambulatory Visit: Payer: Self-pay | Admitting: *Deleted

## 2015-01-22 VITALS — BP 122/80 | HR 96 | Resp 18

## 2015-01-22 DIAGNOSIS — B20 Human immunodeficiency virus [HIV] disease: Secondary | ICD-10-CM

## 2015-01-26 NOTE — Patient Instructions (Signed)
Please refer to progress note for education and instructions provided during today's visit

## 2015-01-26 NOTE — Progress Notes (Signed)
Patient ID: [name], male   DOB: 10/17/1950, 64 y.o.   MRN: [id number] RN conducted home visit with the patient today. The patient states he is feeling well and continues to take his medications daily. Pt stated he hated to miss our last appt and requested that I reschedule his appt with Dr Magnus Ivan so he can have his left hip evaluated. RN contacted Dr Eliberto Ivory office and rescheduled a visit for the patient in Sept. The patient stated he does not have his [employer] yet. RN will f/u with [facility] on the pt's [employer]. RN asked the patient if he would like to get help with his drinking and drug use. The patient declined stating he does not plan on discontinuing his use of alcohol. RN will request once additional visit with the patient for the month of Sept. If the patient continues to comply with his regimen I will discharge the patient that time and he will continue to get community services through [employer])

## 2015-02-19 ENCOUNTER — Ambulatory Visit: Payer: Self-pay | Admitting: *Deleted

## 2015-02-19 VITALS — BP 112/72 | HR 80 | Temp 97.2°F | Resp 18

## 2015-02-19 DIAGNOSIS — B20 Human immunodeficiency virus [HIV] disease: Secondary | ICD-10-CM

## 2015-02-23 ENCOUNTER — Telehealth: Payer: Self-pay | Admitting: *Deleted

## 2015-02-23 NOTE — Telephone Encounter (Signed)
The intent of this communication is to inform the Health Care Team that this patient has been discharged from [facility]) with goals met. Set Goals include: to educate the patient on living with HIV and the disease process. Focus will be around medication adherence and compliance by understanding the process and creating new consistent habits. RN will also assess for the need to include substance abuse counseling to address the patient's depressive feeling and substance use.   Pt has been instructed on how to effectively live HIV and manage the disease process by adhering with his medication regimen daily. Pt shows signs of adhering to his medication regimen. Pt has declined substance abuse counseling but the patient has been made aware of the service and is aware of how to get treatment. Pt has been reconnected to [facility] with a Case Worker as well. Effective 02/19/2015, [name] was discharged and removed from [facility] active patient listing.

## 2015-02-25 NOTE — Progress Notes (Signed)
Patient ID: [name], male   DOB: Jul 09, 1950, 65 y.o.   MRN: [id number] The intent of this communication is to inform the Health Care Team that this patient has been discharged from [facility]) with goals met. Set Goals include: to educate the patient on living with HIV and the disease process. Focus will be around medication adherence and compliance by understanding the process and creating new consistent habits. RN will also assess for the need to include substance abuse counseling to address the patient's depressive feeling and substance use. Pt has been instructed on how to effectively live HIV and manage the disease process by adhering with his medication regimen daily. Pt shows signs of adhering to his medication regimen. Pt has declined substance abuse counseling but the patient has been made aware of the service and is aware of how to get treatment. Pt has been reconnected to [facility] with a Case Worker as well. Effective 02/19/2015, [name] was discharged and removed from [facility] active patient listing. Pt has been feeling well and continues to take his medications each day. Pt stated he has also began to increase his exercise with working out and jogging each day.

## 2015-02-25 NOTE — Patient Instructions (Signed)
Please refer to progress note for details of this visit 

## 2015-03-04 NOTE — Progress Notes (Signed)
Approval faxed to [facility]. [place] Kirchner M, RN  

## 2015-03-06 NOTE — Progress Notes (Signed)
Faxed approval to [facility]. Carsynn Bethune M, RN  

## 2015-04-15 ENCOUNTER — Other Ambulatory Visit: Payer: Self-pay

## 2015-04-28 ENCOUNTER — Ambulatory Visit (INDEPENDENT_AMBULATORY_CARE_PROVIDER_SITE_OTHER): Payer: Self-pay | Admitting: Internal Medicine

## 2015-04-28 ENCOUNTER — Encounter: Payer: Self-pay | Admitting: Internal Medicine

## 2015-04-28 VITALS — BP 132/89 | HR 86 | Temp 98.6°F | Ht 71.0 in | Wt 149.0 lb

## 2015-04-28 DIAGNOSIS — B20 Human immunodeficiency virus [HIV] disease: Secondary | ICD-10-CM

## 2015-04-28 DIAGNOSIS — Z23 Encounter for immunization: Secondary | ICD-10-CM

## 2015-04-28 DIAGNOSIS — B182 Chronic viral hepatitis C: Secondary | ICD-10-CM

## 2015-04-28 NOTE — Assessment & Plan Note (Signed)
His infection remains under reasonably good control since his sister started overseen his care and medications. I will continue his current regimen and see him back after lab work in 6 months. He received his influenza vaccination today.

## 2015-04-28 NOTE — Addendum Note (Signed)
Addended by: Cliffton AstersAMPBELL, Maebel Marasco on: 04/28/2015 10:34 AM   Modules accepted: Orders

## 2015-04-28 NOTE — Progress Notes (Signed)
Patient Active Problem List   Diagnosis Date Noted  . Human immunodeficiency virus (HIV) disease (HCC) 07/03/2006    Priority: High  . Chronic hepatitis C virus infection (HCC) 07/03/2006    Priority: High  . Macrocytic anemia 05/15/2013    Priority: Medium  . Hyperglycemia 05/15/2013    Priority: Medium  . CIGARETTE SMOKER 07/03/2006    Priority: Medium  . Diarrhea 12/17/2014  . Avascular necrosis of hip (HCC) 07/20/2012  . Chronic bronchitis (HCC) 06/18/2012  . HIP FRACTURE, LEFT 06/15/2010  . ATYPICAL MYCOBACTERIAL INFECTION 12/04/2008  . TENDINITIS, THUMB 05/20/2008  . Condyloma acuminatum 07/03/2006  . ERECTILE DYSFUNCTION 07/03/2006  . SUBSTANCE ABUSE, MULTIPLE 07/03/2006  . HX, PERSONAL, TUBERCULOSIS 07/03/2006  . HEPATITIS B, HX OF 07/03/2006    Patient's Medications  New Prescriptions   No medications on file  Previous Medications   ABACAVIR (ZIAGEN) 300 MG TABLET    Take 2 tablets (600 mg total) by mouth daily. Cancel Zidovudine please   DARUNAVIR-COBICISTAT (PREZCOBIX) 800-150 MG PER TABLET    Take 1 tablet by mouth daily. Swallow whole. Do NOT crush, break or chew tablets. Take with food.   LOMOTIL 2.5-0.025 MG PER TABLET    1 to 2 tablets by mouth 4 times a day PRN for diarrhea. Not to exceed 8 tablets per day   TENOFOVIR (VIREAD) 300 MG TABLET    Take 1 tablet (300 mg total) by mouth every morning.  Modified Medications   No medications on file  Discontinued Medications   No medications on file    Subjective: [name] is in for his routine HIV follow-up visit. He takes his HIV medications each morning with breakfast. He recalls missing his morning medications twice in the past month when his sister did not cook breakfast for him and he left home before taking the medications. He returned home and took them later that afternoon. He states that he now takes his medications as soon as he wakes up. He started doing that 2 weeks ago and has not missed  any doses. He is feeling well and without complaints.  Review of Systems: Review of Systems  Constitutional: Negative for fever, chills and diaphoresis.  Respiratory: Negative for cough and shortness of breath.   Cardiovascular: Negative for chest pain.  Skin: Negative for rash.  Psychiatric/Behavioral: Negative for depression and substance abuse. The patient is not nervous/anxious.     Past Medical History  Diagnosis Date  . Hepatitis     HX   B and C  . HIV disease (HCC)   . Tuberculosis     per patient  . Bronchitis, chronic (HCC)     per office note 06/18/12 Dr  Orvan Falconerampbell  . Pneumonia     x 2  . Arthritis     left hip  . Condyloma acuminatum   . Substance abuse     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,  ALCOHOL, CIGARETTE ABUSE    Social History  Substance Use Topics  . Smoking status: Current Some Day Smoker -- 0.20 packs/day for 50 years    Types: Cigarettes  . Smokeless tobacco: Never Used  . Alcohol Use: 25.2 oz/week    42 Standard drinks or equivalent per week     Comment: 6-12oz beers    No family history on file.  No Known Allergies  Objective:  Filed Vitals:   04/28/15 0845  BP: 132/89  Pulse: 86  Temp: 98.6 F (37 C)  TempSrc: Oral  Height:  (1.803 m)  Weight: 149 lb (67.586 kg)   Body mass index is 20.79 kg/(m^2).  Physical Exam  Constitutional: He is oriented to person, place, and time.  Eyes: Conjunctivae are normal.  Cardiovascular: Normal rate and regular rhythm.   No murmur heard. Pulmonary/Chest: Breath sounds normal.  Abdominal: Soft. He exhibits no mass. There is no tenderness.  Musculoskeletal: Normal range of motion.  Neurological: He is alert and oriented to person, place, and time.  Skin: No rash noted.  Psychiatric: Mood and affect normal.    Lab Results Lab Results  Component Value Date   WBC 6.0 12/31/2014   HGB 13.6 12/31/2014   HCT 41.4 12/31/2014   MCV 89.6 12/31/2014   PLT 166 12/31/2014    Lab Results  Component  Value Date   CREATININE 1.09 12/31/2014   BUN 15 12/31/2014   NA 141 12/31/2014   K 4.7 12/31/2014   CL 106 12/31/2014   CO2 24 12/31/2014    Lab Results  Component Value Date   ALT 70* 12/31/2014   AST 99* 12/31/2014   ALKPHOS 85 12/31/2014   BILITOT 0.5 12/31/2014    Lab Results  Component Value Date   CHOL 133 11/05/2014   HDL 52 11/05/2014   LDLCALC 68 11/05/2014   TRIG 63 11/05/2014   CHOLHDL 2.6 11/05/2014    Lab Results HIV 1 RNA QUANT (copies/mL)  Date Value  12/31/2014 94*  11/05/2014 38394*  04/17/2014 <20   CD4 T CELL ABS (/uL)  Date Value  12/31/2014 590  11/05/2014 310*  04/17/2014 480      Problem List Items Addressed This Visit      High   Chronic hepatitis C virus infection (HCC)   Relevant Orders   Hepatitis C RNA quantitative   Human immunodeficiency virus (HIV) disease (HCC)    His infection remains under reasonably good control since his sister started overseen his care and medications. I will continue his current regimen and see him back after lab work in 6 months. He received his influenza vaccination today.      Relevant Orders   T-helper cell (CD4)- ([facility] clinic only)   HIV 1 RNA quant-no reflex-bld   CBC   Comprehensive metabolic panel   Lipid panel   RPR        Cliffton Asters, MD [facility] [phone] pager   [phone] cell 04/28/2015, 9:13 AM

## 2015-06-24 ENCOUNTER — Encounter (HOSPITAL_COMMUNITY): Payer: Self-pay | Admitting: *Deleted

## 2015-06-24 ENCOUNTER — Emergency Department (HOSPITAL_COMMUNITY): Payer: Self-pay

## 2015-06-24 ENCOUNTER — Emergency Department (HOSPITAL_COMMUNITY)
Admission: EM | Admit: 2015-06-24 | Discharge: 2015-06-24 | Disposition: A | Discharge status: Home / Self Care | Payer: Self-pay | Attending: Emergency Medicine | Admitting: Emergency Medicine

## 2015-06-24 DIAGNOSIS — Y999 Unspecified external cause status: Secondary | ICD-10-CM | POA: Insufficient documentation

## 2015-06-24 DIAGNOSIS — M1612 Unilateral primary osteoarthritis, left hip: Secondary | ICD-10-CM | POA: Insufficient documentation

## 2015-06-24 DIAGNOSIS — Z21 Asymptomatic human immunodeficiency virus [HIV] infection status: Secondary | ICD-10-CM | POA: Insufficient documentation

## 2015-06-24 DIAGNOSIS — Y9289 Other specified places as the place of occurrence of the external cause: Secondary | ICD-10-CM | POA: Insufficient documentation

## 2015-06-24 DIAGNOSIS — Z8709 Personal history of other diseases of the respiratory system: Secondary | ICD-10-CM | POA: Insufficient documentation

## 2015-06-24 DIAGNOSIS — Z8701 Personal history of pneumonia (recurrent): Secondary | ICD-10-CM | POA: Insufficient documentation

## 2015-06-24 DIAGNOSIS — Z79899 Other long term (current) drug therapy: Secondary | ICD-10-CM | POA: Insufficient documentation

## 2015-06-24 DIAGNOSIS — Z8719 Personal history of other diseases of the digestive system: Secondary | ICD-10-CM | POA: Insufficient documentation

## 2015-06-24 DIAGNOSIS — Y9389 Activity, other specified: Secondary | ICD-10-CM | POA: Insufficient documentation

## 2015-06-24 DIAGNOSIS — S2242XA Multiple fractures of ribs, left side, initial encounter for closed fracture: Secondary | ICD-10-CM | POA: Insufficient documentation

## 2015-06-24 DIAGNOSIS — W19XXXA Unspecified fall, initial encounter: Secondary | ICD-10-CM | POA: Insufficient documentation

## 2015-06-24 DIAGNOSIS — Z8619 Personal history of other infectious and parasitic diseases: Secondary | ICD-10-CM | POA: Insufficient documentation

## 2015-06-24 DIAGNOSIS — F17210 Nicotine dependence, cigarettes, uncomplicated: Secondary | ICD-10-CM | POA: Insufficient documentation

## 2015-06-24 DIAGNOSIS — Z8611 Personal history of tuberculosis: Secondary | ICD-10-CM | POA: Insufficient documentation

## 2015-06-24 MED ORDER — OXYCODONE-ACETAMINOPHEN 5-325 MG PO TABS: 1.0000 | ORAL_TABLET | Freq: Four times a day (QID) | ORAL | Status: DC | PRN

## 2015-06-24 MED ORDER — OXYCODONE-ACETAMINOPHEN 5-325 MG PO TABS
2.0000 | ORAL_TABLET | Freq: Once | ORAL | Status: AC
  Administered 2015-06-24: 2 via ORAL
  Filled 2015-06-24: qty 2

## 2015-06-24 NOTE — ED Notes (Signed)
Pt transported to xray. NAD

## 2015-06-24 NOTE — ED Notes (Signed)
Pt reports tripping and falling 7 days ago and landed on left side, having left rib pain since. No acute distress noted at triage.

## 2015-06-24 NOTE — ED Notes (Signed)
Pt off unit with xray 

## 2015-06-24 NOTE — Discharge Instructions (Signed)
Mr. Klint Lezcano,  Nice meeting you! Please follow-up with your primary care provider. Return to the emergency department if you develop increasing pain, inability to walk, lose control of your bladder/bowel control, shortness of breath. Feel better soon!  S. Lane Hacker, PA-C

## 2015-06-24 NOTE — ED Notes (Signed)
PA at bedside.
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
## 2015-08-10 ENCOUNTER — Other Ambulatory Visit: Payer: Self-pay | Admitting: *Deleted

## 2015-08-10 ENCOUNTER — Other Ambulatory Visit: Payer: Self-pay | Admitting: Internal Medicine

## 2015-08-10 DIAGNOSIS — B20 Human immunodeficiency virus [HIV] disease: Secondary | ICD-10-CM

## 2015-08-10 MED ORDER — TENOFOVIR DISOPROXIL FUMARATE 300 MG PO TABS: 300.0000 mg | ORAL_TABLET | Freq: Every morning | ORAL | Status: DC

## 2015-08-10 MED ORDER — ABACAVIR SULFATE 300 MG PO TABS: 600.0000 mg | ORAL_TABLET | Freq: Every day | ORAL | Status: DC

## 2015-08-10 MED ORDER — DARUNAVIR-COBICISTAT 800-150 MG PO TABS: 1.0000 | ORAL_TABLET | Freq: Every day | ORAL | Status: DC

## 2015-10-08 ENCOUNTER — Other Ambulatory Visit (INDEPENDENT_AMBULATORY_CARE_PROVIDER_SITE_OTHER): Payer: Self-pay

## 2015-10-08 ENCOUNTER — Other Ambulatory Visit: Payer: Self-pay | Admitting: Internal Medicine

## 2015-10-08 DIAGNOSIS — B182 Chronic viral hepatitis C: Secondary | ICD-10-CM

## 2015-10-08 DIAGNOSIS — B20 Human immunodeficiency virus [HIV] disease: Secondary | ICD-10-CM

## 2015-10-08 DIAGNOSIS — Z113 Encounter for screening for infections with a predominantly sexual mode of transmission: Secondary | ICD-10-CM

## 2015-10-08 DIAGNOSIS — Z79899 Other long term (current) drug therapy: Secondary | ICD-10-CM

## 2015-10-08 LAB — COMPREHENSIVE METABOLIC PANEL
ALBUMIN: 3.5 g/dL — AB (ref 3.6–5.1)
ALT: 106 U/L — ABNORMAL HIGH (ref 9–46)
AST: 120 U/L — ABNORMAL HIGH (ref 10–35)
Alkaline Phosphatase: 111 U/L (ref 40–115)
BUN: 18 mg/dL (ref 7–25)
CALCIUM: 8.8 mg/dL (ref 8.6–10.3)
CHLORIDE: 107 mmol/L (ref 98–110)
CO2: 26 mmol/L (ref 20–31)
Creat: 0.93 mg/dL (ref 0.70–1.25)
Glucose, Bld: 95 mg/dL (ref 65–99)
Potassium: 5.3 mmol/L (ref 3.5–5.3)
SODIUM: 138 mmol/L (ref 135–146)
Total Bilirubin: 0.3 mg/dL (ref 0.2–1.2)
Total Protein: 6.5 g/dL (ref 6.1–8.1)

## 2015-10-08 LAB — LIPID PANEL
CHOL/HDL RATIO: 2.3 ratio (ref <=5.0)
CHOLESTEROL: 124 mg/dL — AB (ref 125–200)
HDL: 54 mg/dL (ref >=40)
LDL Cholesterol: 54 mg/dL (ref <130)
Triglycerides: 82 mg/dL (ref <150)
VLDL: 16 mg/dL (ref <30)

## 2015-10-08 LAB — CBC
HEMATOCRIT: 39.9 % (ref 38.5–50.0)
HEMOGLOBIN: 12.9 g/dL — AB (ref 13.2–17.1)
MCH: 29 pg (ref 27.0–33.0)
MCHC: 32.3 g/dL (ref 32.0–36.0)
MCV: 89.7 fL (ref 80.0–100.0)
MPV: 10.5 fL (ref 7.5–12.5)
Platelets: 180 10*3/uL (ref 140–400)
RBC: 4.45 MIL/uL (ref 4.20–5.80)
RDW: 14.3 % (ref 11.0–15.0)
WBC: 6.3 10*3/uL (ref 3.8–10.8)

## 2015-10-09 LAB — HEPATITIS C RNA QUANTITATIVE
HCV QUANT LOG: 6.43 {Log} — AB (ref <1.18)
HCV QUANT: 2713866 [IU]/mL — AB (ref <15)

## 2015-10-09 LAB — RPR

## 2015-10-09 LAB — HIV-1 RNA QUANT-NO REFLEX-BLD
HIV 1 RNA QUANT: 51 {copies}/mL — AB (ref <20)
HIV-1 RNA Quant, Log: 1.71 Log copies/mL — ABNORMAL HIGH (ref <1.30)

## 2015-10-09 LAB — T-HELPER CELL (CD4) - (RCID CLINIC ONLY)
CD4 T CELL ABS: 480 /uL (ref 400–2700)
CD4 T CELL HELPER: 15 % — AB (ref 33–55)

## 2015-10-12 LAB — HEPATITIS C GENOTYPE

## 2015-10-13 ENCOUNTER — Other Ambulatory Visit: Payer: Self-pay

## 2015-10-13 LAB — LIVER FIBROSIS, FIBROTEST-ACTITEST
ALT: 99 U/L — AB (ref 9–46)
Alpha-2-Macroglobulin: 408 mg/dL — ABNORMAL HIGH (ref 106–279)
Apolipoprotein A1: 161 mg/dL (ref 94–176)
BILIRUBIN: 0.3 mg/dL (ref 0.2–1.2)
FIBROSIS SCORE: 0.7
GGT: 218 U/L — AB (ref 3–70)
Haptoglobin: 127 mg/dL (ref 43–212)
NECROINFLAMMAT ACT SCORE: 0.7
Reference ID: 1512552

## 2015-10-27 ENCOUNTER — Ambulatory Visit: Payer: Self-pay | Admitting: *Deleted

## 2015-10-27 ENCOUNTER — Ambulatory Visit (INDEPENDENT_AMBULATORY_CARE_PROVIDER_SITE_OTHER): Payer: Self-pay | Admitting: Internal Medicine

## 2015-10-27 ENCOUNTER — Encounter: Payer: Self-pay | Admitting: Internal Medicine

## 2015-10-27 VITALS — BP 119/78 | HR 61 | Temp 98.2°F | Wt 152.5 lb

## 2015-10-27 DIAGNOSIS — F1910 Other psychoactive substance abuse, uncomplicated: Secondary | ICD-10-CM

## 2015-10-27 DIAGNOSIS — F17200 Nicotine dependence, unspecified, uncomplicated: Secondary | ICD-10-CM

## 2015-10-27 DIAGNOSIS — B20 Human immunodeficiency virus [HIV] disease: Secondary | ICD-10-CM

## 2015-10-27 DIAGNOSIS — F1410 Cocaine abuse, uncomplicated: Secondary | ICD-10-CM

## 2015-10-27 DIAGNOSIS — K089 Disorder of teeth and supporting structures, unspecified: Secondary | ICD-10-CM | POA: Insufficient documentation

## 2015-10-27 DIAGNOSIS — B182 Chronic viral hepatitis C: Secondary | ICD-10-CM

## 2015-10-27 MED ORDER — DARUNAVIR ETHANOLATE 800 MG PO TABS: 800.0000 mg | ORAL_TABLET | Freq: Every day | ORAL | Status: DC

## 2015-10-27 MED ORDER — ELVITEG-COBIC-EMTRICIT-TENOFAF 150-150-200-10 MG PO TABS: 1.0000 | ORAL_TABLET | Freq: Every day | ORAL | Status: DC

## 2015-10-27 NOTE — Progress Notes (Signed)
Patient Active Problem List   Diagnosis Date Noted  . Human immunodeficiency virus (HIV) disease (HCC) 07/03/2006    Priority: High  . Chronic hepatitis C virus infection (HCC) 07/03/2006    Priority: High  . Macrocytic anemia 05/15/2013    Priority: Medium  . Hyperglycemia 05/15/2013    Priority: Medium  . CIGARETTE SMOKER 07/03/2006    Priority: Medium  . Poor dentition 10/27/2015  . Diarrhea 12/17/2014  . Avascular necrosis of hip (HCC) 07/20/2012  . Chronic bronchitis (HCC) 06/18/2012  . HIP FRACTURE, LEFT 06/15/2010  . ATYPICAL MYCOBACTERIAL INFECTION 12/04/2008  . TENDINITIS, THUMB 05/20/2008  . Condyloma acuminatum 07/03/2006  . ERECTILE DYSFUNCTION 07/03/2006  . Polysubstance abuse 07/03/2006  . HX, PERSONAL, TUBERCULOSIS 07/03/2006  . HEPATITIS B, HX OF 07/03/2006    Patient's Medications  New Prescriptions   DARUNAVIR ETHANOLATE (PREZISTA) 800 MG TABLET    Take 1 tablet (800 mg total) by mouth daily with breakfast.   ELVITEGRAVIR-COBICISTAT-EMTRICITABINE-TENOFOVIR (GENVOYA) 150-150-200-10 MG TABS TABLET    Take 1 tablet by mouth daily with breakfast.  Previous Medications   IBUPROFEN (ADVIL,MOTRIN) 200 MG TABLET    Take 400 mg by mouth every 6 (six) hours as needed for mild pain.   LOMOTIL 2.5-0.025 MG PER TABLET    1 to 2 tablets by mouth 4 times a day PRN for diarrhea. Not to exceed 8 tablets per day   OXYCODONE-ACETAMINOPHEN (ROXICET) 5-325 MG TABLET    Take 1-2 tablets by mouth every 6 (six) hours as needed for severe pain.  Modified Medications   No medications on file  Discontinued Medications   ABACAVIR (ZIAGEN) 300 MG TABLET    Take 2 tablets (600 mg total) by mouth daily. Cancel Zidovudine please   DARUNAVIR-COBICISTAT (PREZCOBIX) 800-150 MG TABLET    Take 1 tablet by mouth daily. Swallow whole. Do NOT crush, break or chew tablets. Take with food.   TENOFOVIR (VIREAD) 300 MG TABLET    Take 1 tablet (300 mg total) by mouth every morning.     Subjective: Ekansh is in for his routine HIV follow-up visit. His sister gives him his HIV medicines each morning. He has not missed any doses. He has been having some problem with itching. This bothers him at night and makes it hard for him to fall asleep. This started about 2 weeks ago. He has not noticed any rash. He has had some numbness of his toes but it is not painful and does not bother him. He continues to smoke cigarettes and drink alcohol intermittently. He denies feeling anxious or depressed.  Review of Systems: Review of Systems  Constitutional: Negative for fever, chills, weight loss, malaise/fatigue and diaphoresis.  HENT: Negative for sore throat.   Respiratory: Negative for cough, sputum production and shortness of breath.   Cardiovascular: Negative for chest pain.  Gastrointestinal: Negative for nausea, vomiting, abdominal pain and diarrhea.  Genitourinary: Negative for dysuria.  Musculoskeletal: Negative for myalgias and joint pain.  Skin: Positive for itching. Negative for rash.  Neurological: Positive for sensory change. Negative for dizziness and headaches.  Psychiatric/Behavioral: Negative for depression and substance abuse. The patient is not nervous/anxious.     Past Medical History  Diagnosis Date  . Hepatitis     HX   B and C  . HIV disease (HCC)   . Tuberculosis     per patient  . Bronchitis, chronic (HCC)     per office note 06/18/12 Dr  [name]  . Pneumonia     x 2  . Arthritis     left hip  . Condyloma acuminatum   . Substance abuse     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,  ALCOHOL, CIGARETTE ABUSE    Social History  Substance Use Topics  . Smoking status: Current Some Day Smoker -- 0.20 packs/day for 50 years    Types: Cigarettes  . Smokeless tobacco: Never Used  . Alcohol Use: 25.2 oz/week    42 Standard drinks or equivalent per week     Comment: 6-12oz beers    No family history on file.  No Known Allergies  Objective:  Filed Vitals:    10/27/15 0904  BP: 119/78  Pulse: 61  Temp: 98.2 F (36.8 C)  TempSrc: Oral  Weight: 152 lb 8 oz (69.174 kg)   Body mass index is 21.28 kg/(m^2).  Physical Exam  Constitutional: He is oriented to person, place, and time.  He is in good spirits.  HENT:  Mouth/Throat: No oropharyngeal exudate.  He has several missing teeth. His remaining teeth are in poor condition.  Eyes: Conjunctivae are normal.  Cardiovascular: Normal rate and regular rhythm.   No murmur heard. Pulmonary/Chest: Breath sounds normal.  Abdominal: Soft. He exhibits no mass. There is no tenderness.  Musculoskeletal: Normal range of motion.  Neurological: He is alert and oriented to person, place, and time.  Skin: No rash noted.  Psychiatric: Mood and affect normal.    Lab Results Lab Results  Component Value Date   WBC 6.3 10/08/2015   HGB 12.9* 10/08/2015   HCT 39.9 10/08/2015   MCV 89.7 10/08/2015   PLT 180 10/08/2015    Lab Results  Component Value Date   CREATININE 0.93 10/08/2015   BUN 18 10/08/2015   NA 138 10/08/2015   K 5.3 10/08/2015   CL 107 10/08/2015   CO2 26 10/08/2015    Lab Results  Component Value Date   ALT 99* 10/08/2015   AST 120* 10/08/2015   ALKPHOS 111 10/08/2015   BILITOT 0.3 10/08/2015    Lab Results  Component Value Date   CHOL 124* 10/08/2015   HDL 54 10/08/2015   LDLCALC 54 10/08/2015   TRIG 82 10/08/2015   CHOLHDL 2.3 10/08/2015    Lab Results HIV 1 RNA QUANT (copies/mL)  Date Value  10/08/2015 51*  12/31/2014 94*  11/05/2014 38394*   CD4 T CELL ABS (/uL)  Date Value  10/08/2015 480  12/31/2014 590  11/05/2014 310*      Problem List Items Addressed This Visit      High   Chronic hepatitis C virus infection (HCC) - Primary    I will have him meet with our pharmacist today to see if we can get him started on hepatitis C therapy soon.      Relevant Medications   elvitegravir-cobicistat-emtricitabine-tenofovir (GENVOYA) 150-150-200-10 MG TABS  tablet   Darunavir Ethanolate (PREZISTA) 800 MG tablet   Human immunodeficiency virus (HIV) disease (HCC)    His HIV infection has come under better control since restarting therapy one year ago. I will simplify his regimen to Genvoya and Prezista. This will also not interact with hepatitis C therapy.      Relevant Medications   elvitegravir-cobicistat-emtricitabine-tenofovir (GENVOYA) 150-150-200-10 MG TABS tablet   Darunavir Ethanolate (PREZISTA) 800 MG tablet   Other Relevant Orders   T-helper cell (CD4)- ([facility] clinic only)   HIV 1 RNA quant-no reflex-bld   CBC   Comprehensive metabolic panel  Medium   CIGARETTE SMOKER    He is not ready to consider quitting yet.        Unprioritized   Polysubstance abuse    I encouraged him to consider quitting alcohol.      Poor dentition    I will have him follow-up in our dental clinic.           Cliffton AstersJohn Lexee Brashears, MD [facility] [phone] pager   [phone] cell 10/27/2015, 9:37 AM

## 2015-10-27 NOTE — Assessment & Plan Note (Signed)
His HIV infection has come under better control since restarting therapy one year ago. I will simplify his regimen to Genvoya and Prezista. This will also not interact with hepatitis C therapy.

## 2015-10-27 NOTE — Assessment & Plan Note (Signed)
He is not ready to consider quitting yet.

## 2015-10-27 NOTE — Assessment & Plan Note (Signed)
I will have him meet with our pharmacist today to see if we can get him started on hepatitis C therapy soon.

## 2015-10-27 NOTE — Assessment & Plan Note (Signed)
I will have him follow-up in our dental clinic.

## 2015-10-27 NOTE — Assessment & Plan Note (Signed)
I encouraged him to consider quitting alcohol.

## 2015-10-27 NOTE — BH Specialist Note (Signed)
Counselor met with [name] today for a warm handoff in the exam room.  Patient was oriented times four with good affect and dress. Patient was alert and talkative.  Patient stated that he was doing ok and had not used any substances for a bout a month (specifically crack cocaine) and a few swallows of bear.  Patient stated that when he drank a few days ago he got very sick and threw up.  Patient shared that he accidentally doubled up on his medication and he feels this contributed to his getting sick.  Patient stated that he has been cutting back on his drinking.  Counselor provided support and encouragement for patient.  Counselor educated patient about substance abuse and the counseling services provided at [facility]. Counselor recommended that patient meet a few times to get a handle on his substance abuse. Patient said he would make an appointment.   Rolena Infante, MA, LPC [facility]

## 2015-11-04 ENCOUNTER — Other Ambulatory Visit: Payer: Self-pay | Admitting: Pharmacist Clinician (PhC)/ Clinical Pharmacy Specialist

## 2015-11-04 MED ORDER — LEDIPASVIR-SOFOSBUVIR 90-400 MG PO TABS: 1.0000 | ORAL_TABLET | Freq: Every day | ORAL | Status: DC

## 2015-11-16 ENCOUNTER — Encounter: Payer: Self-pay | Admitting: Pharmacy Technician

## 2015-12-01 ENCOUNTER — Ambulatory Visit (INDEPENDENT_AMBULATORY_CARE_PROVIDER_SITE_OTHER): Payer: Self-pay | Admitting: Pharmacist

## 2015-12-01 VITALS — Wt 159.0 lb

## 2015-12-01 DIAGNOSIS — B182 Chronic viral hepatitis C: Secondary | ICD-10-CM

## 2015-12-01 NOTE — Patient Instructions (Signed)
For the itching you can try Zyrtec (cetirizine) or Claritin (loratadine) over the counter once daily.

## 2015-12-01 NOTE — Progress Notes (Signed)
HPI: [name] is a 65 y.o. male presenting for HCV and HIV followup.  He started Harvoni on 11/13/15.  He denies missed doses of his HCV or HIV medications. He denies side effects but states he does have occasional itching without any s/sx of a rash.  His itching may also be related to him mowing several yards/day. He has not tried anything for his itching and denies taking any OTC medications.   Allergies: No Known Allergies  Vitals:    Past Medical History: Past Medical History  Diagnosis Date  . Hepatitis     HX   B and C  . HIV disease (HCC)   . Tuberculosis     per patient  . Bronchitis, chronic (HCC)     per office note 06/18/12 Dr  Orvan Falconerampbell  . Pneumonia     x 2  . Arthritis     left hip  . Condyloma acuminatum   . Substance abuse     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,  ALCOHOL, CIGARETTE ABUSE    Social History: Social History   Social History  . Marital Status: Married    Spouse Name: N/A  . Number of Children: N/A  . Years of Education: N/A   Social History Main Topics  . Smoking status: Current Some Day Smoker -- 0.20 packs/day for 50 years    Types: Cigarettes  . Smokeless tobacco: Never Used  . Alcohol Use: 25.2 oz/week    42 Standard drinks or equivalent per week     Comment: 6-12oz beers  . Drug Use: 3.00 per week    Special: "Crack" cocaine     Comment: last smoked crack 3 days ago - 10/27/15 pt stated that it was a month ago he last used  . Sexual Activity: Not Currently     Comment: declined condoms   Other Topics Concern  . None   Social History Narrative   Labs: HIV 1 RNA QUANT (copies/mL)  Date Value  10/08/2015 51*  12/31/2014 94*  11/05/2014 38394*   CD4 T CELL ABS (/uL)  Date Value  10/08/2015 480  12/31/2014 590  11/05/2014 310*   HEP B S AB (no units)  Date Value  07/31/2006 Yes   HCV AB (no units)  Date Value  07/31/2006 Yes    CrCl: CrCl cannot be calculated (Patient has no serum creatinine result on  file.).  Lipids:    Component Value Date/Time   CHOL 124* 10/08/2015 0911   TRIG 82 10/08/2015 0911   HDL 54 10/08/2015 0911   CHOLHDL 2.3 10/08/2015 0911   VLDL 16 10/08/2015 0911   LDLCALC 54 10/08/2015 0911    Assessment: HCV: 1a, F3, previously treatment naive, started Harvoni 11/13/15  HIV: HIV RNA 51, CD4 480.  Previously on Ziagen/Prezcobix/Viread and switched to Genvoya/Prezista on 10/27/15.   Recommendations: Continue Genvoya and Prezista once daily Continue Harvoni x 12 weeks Recommended Claritin (loratadine) once daily for his itching F/U CMP and HCV RNA in 2 weeks F/U CD4, CBC, HIV RNA in August F/U with Dr Orvan Falconerampbell in Dr Orvan Falconerampbell   Synetta FailKelsy E Combs, PharmD Pharmacy Resident [facility] 12/01/2015, 8:52 AM

## 2015-12-09 ENCOUNTER — Telehealth: Payer: Self-pay | Admitting: *Deleted

## 2015-12-09 NOTE — Telephone Encounter (Signed)
[facility], labs, med list.  Faxed as requested. Andree CossHowell, Kashana Breach M, RN

## 2015-12-17 ENCOUNTER — Other Ambulatory Visit: Payer: Self-pay

## 2016-01-13 ENCOUNTER — Other Ambulatory Visit: Payer: Self-pay

## 2016-02-02 ENCOUNTER — Ambulatory Visit: Payer: Self-pay | Admitting: Internal Medicine

## 2017-03-09 ENCOUNTER — Ambulatory Visit (INDEPENDENT_AMBULATORY_CARE_PROVIDER_SITE_OTHER): Payer: Self-pay | Admitting: Internal Medicine

## 2017-03-09 ENCOUNTER — Encounter: Payer: Self-pay | Admitting: Internal Medicine

## 2017-03-09 VITALS — BP 113/76 | HR 74 | Temp 97.9°F | Wt 184.0 lb

## 2017-03-09 DIAGNOSIS — Z23 Encounter for immunization: Secondary | ICD-10-CM

## 2017-03-09 DIAGNOSIS — F1910 Other psychoactive substance abuse, uncomplicated: Secondary | ICD-10-CM

## 2017-03-09 DIAGNOSIS — B182 Chronic viral hepatitis C: Secondary | ICD-10-CM

## 2017-03-09 DIAGNOSIS — B20 Human immunodeficiency virus [HIV] disease: Secondary | ICD-10-CM

## 2017-03-09 NOTE — Assessment & Plan Note (Signed)
Records from jail show that his CD4 count in June was 559 and his HIV viral load was undetectable at less than 20. He will continue Genvoya and Prezista and follow-up here in 3 months.

## 2017-03-09 NOTE — Assessment & Plan Note (Signed)
I will repeat lab work today and have him follow-up with our infectious disease pharmacist to reevaluate for treatment of his chronic hepatitis C.

## 2017-03-09 NOTE — Assessment & Plan Note (Signed)
I encouraged him to stay clean and sober.
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
## 2017-03-11 LAB — LIVER FIBROSIS, FIBROTEST-ACTITEST
ALT: 48 U/L — ABNORMAL HIGH (ref 9–46)
Alpha-2-Macroglobulin: 374 mg/dL — ABNORMAL HIGH (ref 106–279)
Apolipoprotein A1: 140 mg/dL (ref 94–176)
Bilirubin: 0.5 mg/dL (ref 0.2–1.2)
Fibrosis Score: 0.74
GGT: 84 U/L — ABNORMAL HIGH (ref 3–70)
HAPTOGLOBIN: 108 mg/dL (ref 43–212)
NECROINFLAMMAT ACT SCORE: 0.43
Reference ID: 2146441

## 2017-03-13 LAB — COMPREHENSIVE METABOLIC PANEL
AG Ratio: 1.1 (calc) (ref 1.0–2.5)
ALT: 52 U/L — AB (ref 9–46)
AST: 65 U/L — ABNORMAL HIGH (ref 10–35)
Albumin: 4 g/dL (ref 3.6–5.1)
Alkaline phosphatase (APISO): 75 U/L (ref 40–115)
BUN: 16 mg/dL (ref 7–25)
CO2: 28 mmol/L (ref 20–32)
CREATININE: 1 mg/dL (ref 0.70–1.25)
Calcium: 9.5 mg/dL (ref 8.6–10.3)
Chloride: 107 mmol/L (ref 98–110)
GLUCOSE: 90 mg/dL (ref 65–99)
Globulin: 3.6 g/dL (calc) (ref 1.9–3.7)
Potassium: 5.2 mmol/L (ref 3.5–5.3)
SODIUM: 143 mmol/L (ref 135–146)
TOTAL PROTEIN: 7.6 g/dL (ref 6.1–8.1)
Total Bilirubin: 0.5 mg/dL (ref 0.2–1.2)

## 2017-03-13 LAB — HEPATITIS C RNA QUANTITATIVE
HCV Quantitative Log: 6.6 Log IU/mL — ABNORMAL HIGH
HCV RNA, PCR, QN: 3960000 [IU]/mL — AB

## 2017-03-13 LAB — PROTIME-INR
INR: 1
PROTHROMBIN TIME: 10.1 s (ref 9.0–11.5)

## 2017-04-04 ENCOUNTER — Encounter: Payer: Self-pay | Admitting: Internal Medicine

## 2017-05-01 ENCOUNTER — Other Ambulatory Visit: Payer: Self-pay | Admitting: *Deleted

## 2017-05-01 DIAGNOSIS — B20 Human immunodeficiency virus [HIV] disease: Secondary | ICD-10-CM

## 2017-05-01 MED ORDER — DARUNAVIR ETHANOLATE 800 MG PO TABS: 800.0000 mg | ORAL_TABLET | Freq: Every day | ORAL | 11 refills | Status: DC

## 2017-05-01 MED ORDER — ELVITEG-COBIC-EMTRICIT-TENOFAF 150-150-200-10 MG PO TABS: 1.0000 | ORAL_TABLET | Freq: Every day | ORAL | 11 refills | Status: DC

## 2017-05-03 ENCOUNTER — Other Ambulatory Visit: Payer: Self-pay | Admitting: Pharmacist Clinician (PhC)/ Clinical Pharmacy Specialist

## 2017-05-03 DIAGNOSIS — B20 Human immunodeficiency virus [HIV] disease: Secondary | ICD-10-CM

## 2017-05-03 MED ORDER — DARUNAVIR ETHANOLATE 800 MG PO TABS: 800.0000 mg | ORAL_TABLET | Freq: Every day | ORAL | 11 refills | Status: DC

## 2017-05-03 NOTE — Progress Notes (Signed)
Resending refills

## 2017-05-09 ENCOUNTER — Other Ambulatory Visit: Payer: Self-pay | Admitting: Pharmacist Clinician (PhC)/ Clinical Pharmacy Specialist

## 2017-05-09 MED ORDER — LEDIPASVIR-SOFOSBUVIR 90-400 MG PO TABS: 1.0000 | ORAL_TABLET | Freq: Every day | ORAL | 2 refills | Status: DC

## 2017-05-09 NOTE — Progress Notes (Signed)
Print out Harvoni prescription.

## 2017-05-09 NOTE — Progress Notes (Signed)
He has ADAP so we don't need to do patient assistance. Sending 3 months of Harvoni to [facility].

## 2017-05-22 ENCOUNTER — Encounter: Payer: Self-pay | Admitting: Pharmacy Technician

## 2017-05-23 ENCOUNTER — Ambulatory Visit: Payer: Self-pay

## 2017-06-20 ENCOUNTER — Ambulatory Visit (INDEPENDENT_AMBULATORY_CARE_PROVIDER_SITE_OTHER): Payer: Self-pay | Admitting: Pharmacist

## 2017-06-20 DIAGNOSIS — B182 Chronic viral hepatitis C: Secondary | ICD-10-CM

## 2017-06-20 DIAGNOSIS — B20 Human immunodeficiency virus [HIV] disease: Secondary | ICD-10-CM

## 2017-06-20 MED ORDER — ONDANSETRON 4 MG PO TBDP: 4.0000 mg | ORAL_TABLET | Freq: Three times a day (TID) | ORAL | 0 refills | Status: DC | PRN

## 2017-06-20 NOTE — Progress Notes (Signed)
[facility] Pharmacy Visit  HPI: [name] is a 67 y.o. male who presents to the [facility] pharmacy clinic for follow-up of his HIV and Hep C co-infection. He is currently on Genvoya + Prezista for his HIV.  He also has Hep C genotype 1a, F4 with CP class A, and started 12 weeks of Harvoni through HMAP on 12/10.   Patient Active Problem List   Diagnosis Date Noted  . Poor dentition 10/27/2015  . Diarrhea 12/17/2014  . Macrocytic anemia 05/15/2013  . Hyperglycemia 05/15/2013  . Avascular necrosis of hip (HCC) 07/20/2012  . Chronic bronchitis (HCC) 06/18/2012  . HIP FRACTURE, LEFT 06/15/2010  . ATYPICAL MYCOBACTERIAL INFECTION 12/04/2008  . TENDINITIS, THUMB 05/20/2008  . Human immunodeficiency virus (HIV) disease (HCC) 07/03/2006  . Chronic hepatitis C virus infection (HCC) 07/03/2006  . Condyloma acuminatum 07/03/2006  . ERECTILE DYSFUNCTION 07/03/2006  . CIGARETTE SMOKER 07/03/2006  . Polysubstance abuse (HCC) 07/03/2006  . HX, PERSONAL, TUBERCULOSIS 07/03/2006  . HEPATITIS B, HX OF 07/03/2006    Patient's Medications  New Prescriptions   ONDANSETRON (ZOFRAN ODT) 4 MG DISINTEGRATING TABLET    Take 1 tablet (4 mg total) by mouth every 8 (eight) hours as needed for nausea or vomiting.  Previous Medications   DARUNAVIR (PREZISTA) 800 MG TABLET    Take 1 tablet (800 mg total) by mouth daily with breakfast.   ELVITEGRAVIR-COBICISTAT-EMTRICITABINE-TENOFOVIR (GENVOYA) 150-150-200-10 MG TABS TABLET    Take 1 tablet by mouth daily with breakfast.   LEDIPASVIR-SOFOSBUVIR (HARVONI) 90-400 MG TABS    Take 1 tablet by mouth daily.  Modified Medications   No medications on file  Discontinued Medications   No medications on file    Allergies: No Known Allergies  Past Medical History: Past Medical History:  Diagnosis Date  . Arthritis    left hip  . Bronchitis, chronic (HCC)    per office note 06/18/12 Dr  Orvan Falconerampbell  . Condyloma acuminatum   . Hepatitis     HX   B and C  . HIV disease (HCC)   . Pneumonia    x 2  . Substance abuse (HCC)    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,  ALCOHOL, CIGARETTE ABUSE  . Tuberculosis    per patient    Social History: Social History   Socioeconomic History  . Marital status: Married    Spouse name: Not on file  . Number of children: Not on file  . Years of education: Not on file  . Highest education level: Not on file  Social Needs  . Financial resource strain: Not on file  . Food insecurity - worry: Not on file  . Food insecurity - inability: Not on file  . Transportation needs - medical: Not on file  . Transportation needs - non-medical: Not on file  Occupational History  . Not on file  Tobacco Use  . Smoking status: Current Some Day Smoker    Packs/day: 0.20    Years: 50.00    Pack years: 10.00    Types: Cigarettes  . Smokeless tobacco: Never Used  Substance and Sexual Activity  . Alcohol use: Yes    Alcohol/week: 25.2 oz    Types: 42 Standard drinks or equivalent per week    Comment: 6-12oz beers  . Drug use: Yes    Frequency: 3.0 times per week    Types: "Crack" cocaine    Comment: last smoked crack 3 days ago - 10/27/15 pt stated that it was a  month ago he last used  . Sexual activity: Not Currently    Comment: declined condoms  Other Topics Concern  . Not on file  Social History Narrative  . Not on file    Labs: HIV 1 RNA Quant (copies/mL)  Date Value  10/08/2015 51 (H)  12/31/2014 94 (H)  11/05/2014 38,394 (H)   CD4 T Cell Abs (/uL)  Date Value  10/08/2015 480  12/31/2014 590  11/05/2014 310 (L)   Hep B S Ab (no units)  Date Value  07/31/2006 Yes   HCV Ab (no units)  Date Value  07/31/2006 Yes    Lab Results  Component Value Date   HCVGENOTYPE 1a 10/08/2015    Hepatitis C RNA quantitative Latest Ref Rng & Units 03/09/2017 10/08/2015 03/14/2008  HCV Quantitative <15 IU/mL - 0,981,191(Y) 8340000(H)  HCV Quantitative Log NOT DETECT Log IU/mL 6.60(H) 6.43(H) -     AST (U/L)  Date Value  03/09/2017 65 (H)  10/08/2015 120 (H)  12/31/2014 99 (H)   ALT (U/L)  Date Value  03/09/2017 52 (H)  03/09/2017 48 (H)  10/08/2015 99 (H)  10/08/2015 106 (H)  12/31/2014 70 (H)   INR (no units)  Date Value  03/09/2017 1.0  07/28/2012 1.06  07/13/2012 1.00    Fibrosis Score: F4 as assessed by fibrosure   Child-Pugh Score: A  Assessment: Yousif is here today to follow-up for his HIV and Hep C co-infection.  He states that he has been taking his [place] and Prezista everyday.  He started 12 weeks of Harvoni for his genotype 1a virus ~12/10 and is doing well on it. He is already on his 2nd bottle.  He is complaining about some nausea that is occurring when he takes all three medications together. I will send in some Zofran for him to take ~30 minutes before he takes his medications to settle his stomach. He also states he takes some alka seltzer for his acid reflux too.  I verified he was not taking any tums, rolaids, H2RAs, or PPIs and he is not.  I told him to try and limit his alka seltzer use as we are not sure how it affects his Harvoni.  I can't find any drug interactions and I would think it would be ok but just to be sure. He has not missed any doses he tells me today.  I will have him meet with Olegario Messier to renew his HMAP.  I will also get a HIV and Hep C viral load.  He will see Dr. [name] for his EOT visit.   Plan: - Continue Genvoya + Prezista - Continue Harvoni x 12 weeks - Zofran 4 mg ODT q8hs PRN nausea - take 30 mins before Genvoya/Prezista/Harvoni - HIV and Hep C viral loads today - F/u with Dr. [name] 3/5 at 11am  Chakita Mcgraw L. Lovella Hardie, PharmD, AAHIVP, CPP Infectious Diseases Clinical Pharmacist [facility] 06/20/2017, 4:35 PM

## 2017-06-20 NOTE — Progress Notes (Deleted)
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

## 2017-06-21 ENCOUNTER — Other Ambulatory Visit: Payer: Self-pay

## 2017-06-21 DIAGNOSIS — B182 Chronic viral hepatitis C: Secondary | ICD-10-CM

## 2017-06-21 MED ORDER — ONDANSETRON 4 MG PO TBDP: 4.0000 mg | ORAL_TABLET | Freq: Three times a day (TID) | ORAL | 0 refills | Status: DC | PRN

## 2017-06-21 NOTE — Telephone Encounter (Signed)
Patient is calling regarding medications that were prescribed on yesterday.   I checked his records and [facility] [address]  [facility] [address]  I was concerned about the change in pharmacy since it appears he is uninsured. Patient stated he has medicare but there is no insurnce on file.   I will send medication to requested pharmacy and advised the patient to call if there is an issue with the charges.   Laurell Josephsammy K King, RN

## 2017-06-22 ENCOUNTER — Ambulatory Visit: Payer: Self-pay | Admitting: Internal Medicine

## 2017-06-22 LAB — HIV-1 RNA QUANT-NO REFLEX-BLD
HIV 1 RNA QUANT: DETECTED {copies}/mL — AB
HIV-1 RNA QUANT, LOG: DETECTED {Log_copies}/mL — AB

## 2017-06-22 LAB — HEPATITIS C RNA QUANTITATIVE
HCV Quantitative Log: <1.18 Log IU/mL — AB
HCV RNA, PCR, QN: DETECTED [IU]/mL — AB

## 2017-07-28 ENCOUNTER — Other Ambulatory Visit: Payer: Self-pay | Admitting: Internal Medicine

## 2017-07-28 DIAGNOSIS — B182 Chronic viral hepatitis C: Secondary | ICD-10-CM

## 2017-08-03 ENCOUNTER — Other Ambulatory Visit: Payer: Self-pay | Admitting: Internal Medicine

## 2017-08-03 DIAGNOSIS — B182 Chronic viral hepatitis C: Secondary | ICD-10-CM

## 2017-08-08 ENCOUNTER — Ambulatory Visit: Payer: Self-pay | Admitting: Internal Medicine

## 2017-09-04 ENCOUNTER — Encounter: Payer: Self-pay | Admitting: Internal Medicine

## 2017-09-04 ENCOUNTER — Ambulatory Visit (INDEPENDENT_AMBULATORY_CARE_PROVIDER_SITE_OTHER): Payer: Self-pay | Admitting: Internal Medicine

## 2017-09-04 DIAGNOSIS — F17200 Nicotine dependence, unspecified, uncomplicated: Secondary | ICD-10-CM

## 2017-09-04 DIAGNOSIS — B20 Human immunodeficiency virus [HIV] disease: Secondary | ICD-10-CM

## 2017-09-04 DIAGNOSIS — B182 Chronic viral hepatitis C: Secondary | ICD-10-CM

## 2017-09-04 NOTE — Assessment & Plan Note (Signed)
He had one cigarette this morning after not smoking for the past 2 weeks.  I encouraged him to consider quitting completely.

## 2017-09-04 NOTE — Progress Notes (Signed)
Patient Active Problem List   Diagnosis Date Noted  . Human immunodeficiency virus (HIV) disease (HCC) 07/03/2006    Priority: High  . Chronic hepatitis C virus infection (HCC) 07/03/2006    Priority: High  . Macrocytic anemia 05/15/2013    Priority: Medium  . Hyperglycemia 05/15/2013    Priority: Medium  . CIGARETTE SMOKER 07/03/2006    Priority: Medium  . Poor dentition 10/27/2015  . Diarrhea 12/17/2014  . Avascular necrosis of hip (HCC) 07/20/2012  . Chronic bronchitis (HCC) 06/18/2012  . HIP FRACTURE, LEFT 06/15/2010  . ATYPICAL MYCOBACTERIAL INFECTION 12/04/2008  . TENDINITIS, THUMB 05/20/2008  . Condyloma acuminatum 07/03/2006  . ERECTILE DYSFUNCTION 07/03/2006  . Polysubstance abuse (HCC) 07/03/2006  . HX, PERSONAL, TUBERCULOSIS 07/03/2006  . HEPATITIS B, HX OF 07/03/2006    Patient's Medications  New Prescriptions   No medications on file  Previous Medications   DARUNAVIR (PREZISTA) 800 MG TABLET    Take 1 tablet (800 mg total) by mouth daily with breakfast.   ELVITEGRAVIR-COBICISTAT-EMTRICITABINE-TENOFOVIR (GENVOYA) 150-150-200-10 MG TABS TABLET    Take 1 tablet by mouth daily with breakfast.   LEDIPASVIR-SOFOSBUVIR (HARVONI) 90-400 MG TABS    Take 1 tablet by mouth daily.   ONDANSETRON (ZOFRAN-ODT) 4 MG DISINTEGRATING TABLET    DISSOLVE ONE TABLET BY MOUTH EVERY 8 HOURS AS NEEDED FOR NAUSEA AND VOMITING  Modified Medications   No medications on file  Discontinued Medications   No medications on file    Subjective: [name] is in for his routine follow-up visit.  He completed 12 weeks of Harvoni for his chronic hepatitis C 2 weeks ago.  He does not think that he missed any doses while he was on it.  He said that he had some nausea while he was on it that he thought was [facility] but he is still bothered by some mild nausea now.  He does not think that he is missed any doses of his Genvoya or Prezista.  He says that he does drink alcohol but not  nearly as much as he used to.  He still smokes an occasional cigarette.  He believes that he has 2-3 more months of probation.  Review of Systems: Review of Systems  Constitutional: Negative for chills, diaphoresis, fever, malaise/fatigue and weight loss.  HENT: Negative for sore throat.   Eyes:       He says that he got a pair of reading glasses when he was in jail last year.  He was told that he had a cataract in his left eye.  He says that the cataract makes it feel like he has spider webs over his left eye.  Respiratory: Negative for cough, sputum production and shortness of breath.   Cardiovascular: Negative for chest pain.  Gastrointestinal: Positive for nausea. Negative for abdominal pain, diarrhea, heartburn and vomiting.  Genitourinary: Negative for dysuria and frequency.  Musculoskeletal: Negative for joint pain and myalgias.  Skin: Negative for rash.  Neurological: Negative for dizziness and headaches.  Psychiatric/Behavioral: Negative for depression and substance abuse. The patient is not nervous/anxious.     Past Medical History:  Diagnosis Date  . Arthritis    left hip  . Bronchitis, chronic (HCC)    per office note 06/18/12 Dr  Orvan Falconerampbell  . Condyloma acuminatum   . Hepatitis    HX   B and C  . HIV disease (HCC)   . Pneumonia    x 2  .  Substance abuse (HCC)    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,  ALCOHOL, CIGARETTE ABUSE  . Tuberculosis    per patient    Social History   Tobacco Use  . Smoking status: Current Some Day Smoker    Packs/day: 0.20    Years: 50.00    Pack years: 10.00    Types: Cigarettes  . Smokeless tobacco: Never Used  Substance Use Topics  . Alcohol use: Yes    Alcohol/week: 25.2 oz    Types: 42 Standard drinks or equivalent per week    Comment: 6-12oz beers  . Drug use: Yes    Frequency: 3.0 times per week    Types: "Crack" cocaine    Comment: last smoked crack 3 days ago - 10/27/15 pt stated that it was a month ago he last used    No family  history on file.  No Known Allergies  Health Maintenance  Topic Date Due  . TETANUS/TDAP  03/16/1970  . COLONOSCOPY  03/16/2001  . INFLUENZA VACCINE  01/04/2018  . PNA vac Low Risk Adult (2 of 2 - PCV13) 03/09/2018  . Hepatitis C Screening  Completed    Objective:  Vitals:   09/04/17 1154  BP: 109/71  Pulse: 88  Temp: 98.6 F (37 C)  TempSrc: Oral  Weight: 201 lb (91.2 kg)   Body mass index is 28.03 kg/m.  Physical Exam  Constitutional: He is oriented to person, place, and time.  He is calm and in good spirits.  HENT:  Mouth/Throat: No oropharyngeal exudate.  Eyes: Conjunctivae are normal.  Cardiovascular: Normal rate and regular rhythm.  No murmur heard. Pulmonary/Chest: Breath sounds normal.  Abdominal: Soft. He exhibits distension. He exhibits no mass. There is no tenderness.  Musculoskeletal: Normal range of motion.  Neurological: He is alert and oriented to person, place, and time.  Skin: No rash noted.  Psychiatric: Mood and affect normal.    Lab Results Lab Results  Component Value Date   WBC 6.3 10/08/2015   HGB 12.9 (L) 10/08/2015   HCT 39.9 10/08/2015   MCV 89.7 10/08/2015   PLT 180 10/08/2015    Lab Results  Component Value Date   CREATININE 1.00 03/09/2017   BUN 16 03/09/2017   NA 143 03/09/2017   K 5.2 03/09/2017   CL 107 03/09/2017   CO2 28 03/09/2017    Lab Results  Component Value Date   ALT 52 (H) 03/09/2017   ALT 48 (H) 03/09/2017   AST 65 (H) 03/09/2017   ALKPHOS 111 10/08/2015   BILITOT 0.5 03/09/2017    Lab Results  Component Value Date   CHOL 124 (L) 10/08/2015   HDL 54 10/08/2015   LDLCALC 54 10/08/2015   TRIG 82 10/08/2015   CHOLHDL 2.3 10/08/2015   Lab Results  Component Value Date   LABRPR NON REAC 10/08/2015   HIV 1 RNA Quant (copies/mL)  Date Value  06/20/2017 <20 DETECTED (A)  10/08/2015 51 (H)  12/31/2014 94 (H)   CD4 T Cell Abs (/uL)  Date Value  10/08/2015 480  12/31/2014 590  11/05/2014 310 (L)      Problem List Items Addressed This Visit      High   Chronic hepatitis C virus infection (HCC)    His hepatitis C viral load was undetectable on 06/20/2017.  I will check an end of therapy viral load and see him back in 3 months.  I warned him about the potential adverse effect of continuing alcohol intake on his  cirrhosis.      Relevant Orders   Hepatitis C RNA quantitative   Human immunodeficiency virus (HIV) disease (HCC)    His HIV viral load was undetectable on 06/20/2017.  He will continue his current antiretroviral regimen and follow-up in 3 months.      Relevant Orders   T-helper cell (CD4)- ([facility] clinic only)   HIV 1 RNA quant-no reflex-bld   CBC   Comprehensive metabolic panel   RPR   Lipid panel     Medium   CIGARETTE SMOKER    He had one cigarette this morning after not smoking for the past 2 weeks.  I encouraged him to consider quitting completely.           Cliffton Asters, MD [facility] for Infectious Disease [facility] [phone] pager   [phone] cell 09/04/2017, 12:13 PM

## 2017-09-04 NOTE — Assessment & Plan Note (Signed)
His HIV viral load was undetectable on 06/20/2017.  He will continue his current antiretroviral regimen and follow-up in 3 months.

## 2017-09-04 NOTE — Assessment & Plan Note (Addendum)
His hepatitis C viral load was undetectable on 06/20/2017.  I will check an end of therapy viral load and see him back in 3 months.  I warned him about the potential adverse effect of continuing alcohol intake on his cirrhosis.

## 2017-09-05 LAB — COMPREHENSIVE METABOLIC PANEL
AG Ratio: 1.4 (calc) (ref 1.0–2.5)
ALBUMIN MSPROF: 4.3 g/dL (ref 3.6–5.1)
ALKALINE PHOSPHATASE (APISO): 76 U/L (ref 40–115)
ALT: 14 U/L (ref 9–46)
AST: 18 U/L (ref 10–35)
BUN: 13 mg/dL (ref 7–25)
CALCIUM: 9.7 mg/dL (ref 8.6–10.3)
CHLORIDE: 105 mmol/L (ref 98–110)
CO2: 23 mmol/L (ref 20–32)
Creat: 1.03 mg/dL (ref 0.70–1.25)
GLOBULIN: 3.1 g/dL (ref 1.9–3.7)
Glucose, Bld: 114 mg/dL — ABNORMAL HIGH (ref 65–99)
POTASSIUM: 4.2 mmol/L (ref 3.5–5.3)
Sodium: 140 mmol/L (ref 135–146)
Total Bilirubin: 0.5 mg/dL (ref 0.2–1.2)
Total Protein: 7.4 g/dL (ref 6.1–8.1)

## 2017-09-05 LAB — CBC
HEMATOCRIT: 44.5 % (ref 38.5–50.0)
Hemoglobin: 14.8 g/dL (ref 13.2–17.1)
MCH: 28.6 pg (ref 27.0–33.0)
MCHC: 33.3 g/dL (ref 32.0–36.0)
MCV: 85.9 fL (ref 80.0–100.0)
MPV: 10.7 fL (ref 7.5–12.5)
Platelets: 272 10*3/uL (ref 140–400)
RBC: 5.18 10*6/uL (ref 4.20–5.80)
RDW: 13.2 % (ref 11.0–15.0)
WBC: 9.2 10*3/uL (ref 3.8–10.8)

## 2017-09-05 LAB — LIPID PANEL
CHOLESTEROL: 272 mg/dL — AB (ref <200)
HDL: 52 mg/dL (ref >40)
LDL Cholesterol (Calc): 176 mg/dL (calc) — ABNORMAL HIGH
Non-HDL Cholesterol (Calc): 220 mg/dL (calc) — ABNORMAL HIGH (ref <130)
Total CHOL/HDL Ratio: 5.2 (calc) — ABNORMAL HIGH (ref <5.0)
Triglycerides: 235 mg/dL — ABNORMAL HIGH (ref <150)

## 2017-09-05 LAB — T-HELPER CELL (CD4) - (RCID CLINIC ONLY)
CD4 % Helper T Cell: 14 % — ABNORMAL LOW (ref 33–55)
CD4 T Cell Abs: 670 /uL (ref 400–2700)

## 2017-09-05 LAB — RPR: RPR Ser Ql: NONREACTIVE

## 2017-09-06 LAB — HEPATITIS C RNA QUANTITATIVE
HCV Quantitative Log: <1.18 Log IU/mL
HCV RNA, PCR, QN: NOT DETECTED [IU]/mL

## 2017-09-06 LAB — HIV-1 RNA QUANT-NO REFLEX-BLD
HIV 1 RNA Quant: <20 copies/mL — AB
HIV-1 RNA Quant, Log: <1.3 Log copies/mL — AB

## 2017-09-18 ENCOUNTER — Other Ambulatory Visit: Payer: Self-pay | Admitting: *Deleted

## 2017-09-18 DIAGNOSIS — B20 Human immunodeficiency virus [HIV] disease: Secondary | ICD-10-CM

## 2017-09-18 MED ORDER — DARUNAVIR ETHANOLATE 800 MG PO TABS: 800.0000 mg | ORAL_TABLET | Freq: Every day | ORAL | 5 refills | Status: DC

## 2017-09-18 MED ORDER — ELVITEG-COBIC-EMTRICIT-TENOFAF 150-150-200-10 MG PO TABS: 1.0000 | ORAL_TABLET | Freq: Every day | ORAL | 5 refills | Status: DC

## 2017-10-03 ENCOUNTER — Encounter: Payer: Self-pay | Admitting: Internal Medicine

## 2017-10-11 ENCOUNTER — Telehealth: Payer: Self-pay | Admitting: Behavioral Health

## 2017-10-11 NOTE — Telephone Encounter (Signed)
Patient has been out of Prezista since 4/3, has been unable to get this refilled from [facility].  RN called [facility] to clarify, spoke with Jonny Ruiz.  Patient's ADAP expired 3/31, he had a last fill of Prezista/Genvoya 3/4.  When he attempted to fill 4/4, [facility] got him a coupon to [employer], but DID NOT dispense Prezista as they could not find coverage for it. Patient came to apply for ADAP 4/29, got PAF with Morrie Sheldon to cover both Prezista/Genvoya, but [facility] was unaware of this payor and continued to use only coupon for Genvoya and dispensed ONLY Genvoya when he requested refills 5/4. RN gave [facility] PAF information for Prezista, asked that they dispense COMPLETE regimens each time and to notify [facility] immediately if they cannot. Andree Coss, RN

## 2017-10-11 NOTE — Telephone Encounter (Signed)
Angelina Cobb called on behalf of Mr. [name] stating he is unable to get his Prezista but is able to get Genvoya.  According to her patient has been with out his medications for almost 2 months.

## 2017-10-19 ENCOUNTER — Telehealth: Payer: Self-pay | Admitting: *Deleted

## 2017-10-19 NOTE — Telephone Encounter (Signed)
Called the patient and his sister to advise that we have received the forms to renew his PA for medication and we will sent out as soon as the doctor sign them. Was unable to leave message as phone just rang and no one answered.

## 2017-12-19 ENCOUNTER — Ambulatory Visit: Payer: Self-pay | Admitting: Internal Medicine

## 2018-01-02 ENCOUNTER — Ambulatory Visit (INDEPENDENT_AMBULATORY_CARE_PROVIDER_SITE_OTHER): Payer: Self-pay | Admitting: Internal Medicine

## 2018-01-02 ENCOUNTER — Encounter: Payer: Self-pay | Admitting: Internal Medicine

## 2018-01-02 DIAGNOSIS — F17200 Nicotine dependence, unspecified, uncomplicated: Secondary | ICD-10-CM

## 2018-01-02 DIAGNOSIS — B182 Chronic viral hepatitis C: Secondary | ICD-10-CM

## 2018-01-02 DIAGNOSIS — B20 Human immunodeficiency virus [HIV] disease: Secondary | ICD-10-CM

## 2018-01-02 NOTE — Progress Notes (Signed)
Patient Active Problem List   Diagnosis Date Noted  . Human immunodeficiency virus (HIV) disease (HCC) 07/03/2006    Priority: High  . Chronic hepatitis C virus infection (HCC) 07/03/2006    Priority: High  . Macrocytic anemia 05/15/2013    Priority: Medium  . Hyperglycemia 05/15/2013    Priority: Medium  . CIGARETTE SMOKER 07/03/2006    Priority: Medium  . Poor dentition 10/27/2015  . Diarrhea 12/17/2014  . Avascular necrosis of hip (HCC) 07/20/2012  . Chronic bronchitis (HCC) 06/18/2012  . HIP FRACTURE, LEFT 06/15/2010  . ATYPICAL MYCOBACTERIAL INFECTION 12/04/2008  . TENDINITIS, THUMB 05/20/2008  . Condyloma acuminatum 07/03/2006  . ERECTILE DYSFUNCTION 07/03/2006  . Polysubstance abuse (HCC) 07/03/2006  . HX, PERSONAL, TUBERCULOSIS 07/03/2006  . HEPATITIS B, HX OF 07/03/2006    Patient's Medications  New Prescriptions   No medications on file  Previous Medications   DARUNAVIR (PREZISTA) 800 MG TABLET    Take 1 tablet (800 mg total) by mouth daily with breakfast.   ELVITEGRAVIR-COBICISTAT-EMTRICITABINE-TENOFOVIR (GENVOYA) 150-150-200-10 MG TABS TABLET    Take 1 tablet by mouth daily with breakfast.   ONDANSETRON (ZOFRAN-ODT) 4 MG DISINTEGRATING TABLET    DISSOLVE ONE TABLET BY MOUTH EVERY 8 HOURS AS NEEDED FOR NAUSEA AND VOMITING  Modified Medications   No medications on file  Discontinued Medications   LEDIPASVIR-SOFOSBUVIR (HARVONI) 90-400 MG TABS    Take 1 tablet by mouth daily.    Subjective: [name] is in for his routine HIV follow-up visit.  He says that he failed to recertify ADAP and ran out of his medications last month.  He was out for 2 weeks straight.  He denies missing doses of his Genvoya or Prezista at other times.  He is now off of probation.  He says that he has not been drinking alcohol and that he quit smoking cigarettes 3 weeks ago.  He is feeling better.  He is bothered by mild generalized pruritus and aching in his joints.  He has  occasional nausea.  When I reminded him that he has a prescription for an ondansetron he said he had forgotten that he could take that.  Review of Systems: Review of Systems  Constitutional: Negative for chills, diaphoresis, fever, malaise/fatigue and weight loss.  HENT: Negative for sore throat.   Respiratory: Negative for cough, sputum production and shortness of breath.   Cardiovascular: Negative for chest pain.  Gastrointestinal: Positive for heartburn. Negative for abdominal pain, diarrhea, nausea and vomiting.  Genitourinary: Negative for dysuria and frequency.  Musculoskeletal: Positive for joint pain. Negative for myalgias.  Skin: Positive for itching. Negative for rash.  Neurological: Negative for dizziness and headaches.  Psychiatric/Behavioral: Negative for depression and substance abuse. The patient is not nervous/anxious.     Past Medical History:  Diagnosis Date  . Arthritis    left hip  . Bronchitis, chronic (HCC)    per office note 06/18/12 Dr  [name]  . Condyloma acuminatum   . Hepatitis    HX   B and C  . HIV disease (HCC)   . Pneumonia    x 2  . Substance abuse (HCC)    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,  ALCOHOL, CIGARETTE ABUSE  . Tuberculosis    per patient    Social History   Tobacco Use  . Smoking status: Current Some Day Smoker    Packs/day: 0.20    Years: 50.00    Pack years: 10.00  Types: Cigarettes  . Smokeless tobacco: Never Used  Substance Use Topics  . Alcohol use: Yes    Alcohol/week: 25.2 oz    Types: 42 Standard drinks or equivalent per week    Comment: 6-12oz beers  . Drug use: Yes    Frequency: 3.0 times per week    Types: "Crack" cocaine    Comment: last smoked crack 3 days ago - 10/27/15 pt stated that it was a month ago he last used    No family history on file.  No Known Allergies  Health Maintenance  Topic Date Due  . TETANUS/TDAP  03/16/1970  . COLONOSCOPY  03/16/2001  . INFLUENZA VACCINE  01/04/2018  . PNA vac Low Risk  Adult (2 of 2 - PCV13) 03/09/2018  . Hepatitis C Screening  Completed    Objective:  Vitals:   01/02/18 1121  BP: 124/79  Pulse: 60  Temp: (!) 97.3 F (36.3 C)  TempSrc: Oral  Weight: 201 lb (91.2 kg)   Body mass index is 28.03 kg/m.  Physical Exam  Constitutional: He is oriented to person, place, and time.  He is in good spirits.  He has gained 42 pounds in the past 2 years.  HENT:  Mouth/Throat: No oropharyngeal exudate.  Eyes: Conjunctivae are normal.  Cardiovascular: Normal rate, regular rhythm and normal heart sounds.  No murmur heard. Pulmonary/Chest: Effort normal and breath sounds normal.  Abdominal: Soft. He exhibits distension. He exhibits no mass. There is no tenderness.  He has ascites.  Musculoskeletal: Normal range of motion.  Neurological: He is alert and oriented to person, place, and time.  Skin: No rash noted.  Psychiatric: He has a normal mood and affect.    Lab Results Lab Results  Component Value Date   WBC 9.2 09/04/2017   HGB 14.8 09/04/2017   HCT 44.5 09/04/2017   MCV 85.9 09/04/2017   PLT 272 09/04/2017    Lab Results  Component Value Date   CREATININE 1.03 09/04/2017   BUN 13 09/04/2017   NA 140 09/04/2017   K 4.2 09/04/2017   CL 105 09/04/2017   CO2 23 09/04/2017    Lab Results  Component Value Date   ALT 14 09/04/2017   AST 18 09/04/2017   ALKPHOS 111 10/08/2015   BILITOT 0.5 09/04/2017    Lab Results  Component Value Date   CHOL 272 (H) 09/04/2017   HDL 52 09/04/2017   LDLCALC 176 (H) 09/04/2017   TRIG 235 (H) 09/04/2017   CHOLHDL 5.2 (H) 09/04/2017   Lab Results  Component Value Date   LABRPR NON-REACTIVE 09/04/2017   HIV 1 RNA Quant (copies/mL)  Date Value  09/04/2017 <20 DETECTED (A)  06/20/2017 <20 DETECTED (A)  10/08/2015 51 (H)   CD4 T Cell Abs (/uL)  Date Value  09/04/2017 670  10/08/2015 480  12/31/2014 590     Problem List Items Addressed This Visit      High   Chronic hepatitis C virus  infection (HCC)    I am quite hopeful that his hepatitis C has been cured following 12 weeks of Harvoni therapy.  He will get lab work today for SVR 24.      Relevant Orders   Hepatitis C RNA quantitative   Human immunodeficiency virus (HIV) disease (HCC)    He will continue Genvoya and Prezista and get repeat lab work today.  I will see him back in 6 months.  I reminded him that he will need to recertify ADAP  twice each year.      Relevant Orders   T-helper cell (CD4)- ([facility] clinic only)   HIV 1 RNA quant-no reflex-bld     Medium   CIGARETTE SMOKER    I congratulated him on quitting smoking.           Cliffton Asters, MD [facility] for Infectious Disease [facility] [phone] pager   [phone] cell 01/02/2018, 11:43 AM

## 2018-01-02 NOTE — Assessment & Plan Note (Signed)
He will continue Genvoya and Prezista and get repeat lab work today.  I will see him back in 6 months.  I reminded him that he will need to recertify ADAP twice each year.

## 2018-01-02 NOTE — Assessment & Plan Note (Signed)
I am quite hopeful that his hepatitis C has been cured following 12 weeks of Harvoni therapy.  He will get lab work today for SVR 24.

## 2018-01-02 NOTE — Assessment & Plan Note (Signed)
I congratulated him on quitting smoking. 

## 2018-01-03 LAB — T-HELPER CELL (CD4) - (RCID CLINIC ONLY)
CD4 % Helper T Cell: 16 % — ABNORMAL LOW (ref 33–55)
CD4 T CELL ABS: 660 /uL (ref 400–2700)

## 2018-01-04 LAB — HEPATITIS C RNA QUANTITATIVE
HCV Quantitative Log: <1.18 Log IU/mL
HCV RNA, PCR, QN: NOT DETECTED [IU]/mL

## 2018-01-05 LAB — HIV-1 RNA QUANT-NO REFLEX-BLD
HIV 1 RNA Quant: 51 copies/mL — ABNORMAL HIGH
HIV-1 RNA Quant, Log: 1.71 Log copies/mL — ABNORMAL HIGH

## 2018-02-15 ENCOUNTER — Other Ambulatory Visit: Payer: Self-pay | Admitting: Internal Medicine

## 2018-02-15 DIAGNOSIS — B20 Human immunodeficiency virus [HIV] disease: Secondary | ICD-10-CM

## 2018-03-27 ENCOUNTER — Encounter: Payer: Self-pay | Admitting: Internal Medicine

## 2018-07-05 ENCOUNTER — Ambulatory Visit (INDEPENDENT_AMBULATORY_CARE_PROVIDER_SITE_OTHER): Payer: Self-pay | Admitting: Internal Medicine

## 2018-07-05 ENCOUNTER — Encounter: Payer: Self-pay | Admitting: Internal Medicine

## 2018-07-05 VITALS — BP 124/84 | HR 91 | Temp 98.4°F | Wt 195.0 lb

## 2018-07-05 DIAGNOSIS — B182 Chronic viral hepatitis C: Secondary | ICD-10-CM

## 2018-07-05 DIAGNOSIS — Z23 Encounter for immunization: Secondary | ICD-10-CM

## 2018-07-05 DIAGNOSIS — Z8619 Personal history of other infectious and parasitic diseases: Secondary | ICD-10-CM

## 2018-07-05 DIAGNOSIS — B20 Human immunodeficiency virus [HIV] disease: Secondary | ICD-10-CM

## 2018-07-05 DIAGNOSIS — Z79899 Other long term (current) drug therapy: Secondary | ICD-10-CM

## 2018-07-05 DIAGNOSIS — R197 Diarrhea, unspecified: Secondary | ICD-10-CM

## 2018-07-05 DIAGNOSIS — R110 Nausea: Secondary | ICD-10-CM

## 2018-07-05 DIAGNOSIS — F17200 Nicotine dependence, unspecified, uncomplicated: Secondary | ICD-10-CM

## 2018-07-05 DIAGNOSIS — R1012 Left upper quadrant pain: Secondary | ICD-10-CM

## 2018-07-05 DIAGNOSIS — Z720 Tobacco use: Secondary | ICD-10-CM

## 2018-07-05 NOTE — Progress Notes (Addendum)
Patient Active Problem List   Diagnosis Date Noted  . Human immunodeficiency virus (HIV) disease (HCC) 07/03/2006    Priority: High  . Chronic hepatitis C virus infection (HCC) 07/03/2006    Priority: High  . Macrocytic anemia 05/15/2013    Priority: Medium  . Hyperglycemia 05/15/2013    Priority: Medium  . CIGARETTE SMOKER 07/03/2006    Priority: Medium  . Poor dentition 10/27/2015  . Avascular necrosis of hip (HCC) 07/20/2012  . Chronic bronchitis (HCC) 06/18/2012  . HIP FRACTURE, LEFT 06/15/2010  . ATYPICAL MYCOBACTERIAL INFECTION 12/04/2008  . TENDINITIS, THUMB 05/20/2008  . Condyloma acuminatum 07/03/2006  . ERECTILE DYSFUNCTION 07/03/2006  . Polysubstance abuse (HCC) 07/03/2006  . HX, PERSONAL, TUBERCULOSIS 07/03/2006  . HEPATITIS B, HX OF 07/03/2006    Patient's Medications  New Prescriptions   No medications on file  Previous Medications   GENVOYA 150-150-200-10 MG TABS TABLET    TAKE 1 TABLET BY MOUTH DAILY WITH BREAKFAST   ONDANSETRON (ZOFRAN-ODT) 4 MG DISINTEGRATING TABLET    DISSOLVE ONE TABLET BY MOUTH EVERY 8 HOURS AS NEEDED FOR NAUSEA AND VOMITING   PREZISTA 800 MG TABLET    TAKE 1 TABLET BY MOUTH DAILY WITH BREAKFAST  Modified Medications   No medications on file  Discontinued Medications   No medications on file    Subjective: [name] is in for his routine HIV follow-up visit.  He tells me that he did not renew his Medicaid card and ran out of of his Genvoya and Prezista about 3 weeks ago.  He was able to get it refilled yesterday.  He says he was not missing any doses when he had his medication.  He is still smoking about 5 cigarettes daily.  He had 2 beers yesterday.  He has had some left upper quadrant pain intermittently recently and occasionally has some nausea without vomiting.  He says that when he is drinking alcohol he will have diarrhea.  He has not noted any blood in his stool.  He now tells me that he had a few episodes of hematemesis  when he was in jail in 2018.  Review of Systems: Review of Systems  Constitutional: Negative for chills, diaphoresis, fever, malaise/fatigue and weight loss.  HENT: Negative for sore throat.   Respiratory: Negative for cough, sputum production and shortness of breath.   Cardiovascular: Negative for chest pain.  Gastrointestinal: Positive for abdominal pain, diarrhea and nausea. Negative for heartburn and vomiting.  Genitourinary: Negative for dysuria and frequency.  Musculoskeletal: Negative for joint pain and myalgias.  Skin: Negative for rash.  Neurological: Negative for dizziness and headaches.  Psychiatric/Behavioral: Negative for depression and substance abuse. The patient is not nervous/anxious.     Past Medical History:  Diagnosis Date  . Arthritis    left hip  . Bronchitis, chronic (HCC)    per office note 06/18/12 Dr  [name]  . Condyloma acuminatum   . Hepatitis    HX   B and C  . HIV disease (HCC)   . Pneumonia    x 2  . Substance abuse (HCC)    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,  ALCOHOL, CIGARETTE ABUSE  . Tuberculosis    per patient    Social History   Tobacco Use  . Smoking status: Current Some Day Smoker    Packs/day: 0.20    Years: 50.00    Pack years: 10.00    Types: Cigarettes  . Smokeless tobacco: Never  Used  Substance Use Topics  . Alcohol use: Yes    Alcohol/week: 42.0 standard drinks    Types: 42 Standard drinks or equivalent per week    Comment: 6-12oz beers  . Drug use: Yes    Frequency: 3.0 times per week    Types: "Crack" cocaine    Comment: last smoked crack 3 days ago - 10/27/15 pt stated that it was a month ago he last used    No family history on file.  No Known Allergies  Health Maintenance  Topic Date Due  . TETANUS/TDAP  03/16/1970  . COLONOSCOPY  03/16/2001  . INFLUENZA VACCINE  01/04/2018  . PNA vac Low Risk Adult (2 of 2 - PCV13) 03/09/2018  . Hepatitis C Screening  Completed    Objective:  Vitals:   07/05/18 0925  BP:  124/84  Pulse: 91  Temp: 98.4 F (36.9 C)  Weight: 195 lb (88.5 kg)   Body mass index is 27.2 kg/m.  Physical Exam Constitutional:      Comments: He is pleasant and in good spirits.  HENT:     Head:     Comments: Multiple missing teeth.    Mouth/Throat:     Pharynx: No oropharyngeal exudate.  Eyes:     Conjunctiva/sclera: Conjunctivae normal.  Cardiovascular:     Rate and Rhythm: Normal rate and regular rhythm.     Heart sounds: No murmur.  Pulmonary:     Effort: Pulmonary effort is normal.     Breath sounds: Normal breath sounds.  Abdominal:     General: There is distension.     Palpations: Abdomen is soft. There is no mass.     Tenderness: There is abdominal tenderness.     Comments: I can feel a firm liver edge just below the right costal margin on deep inspiration.  He has some fullness in his left upper quadrant and tenderness there with palpation.  I think he has ascites.  He is status post splenectomy following traumatic splenic rupture in 2006.  Musculoskeletal: Normal range of motion.  Skin:    Findings: No rash.  Neurological:     Mental Status: He is alert and oriented to person, place, and time.     Lab Results Lab Results  Component Value Date   WBC 9.2 09/04/2017   HGB 14.8 09/04/2017   HCT 44.5 09/04/2017   MCV 85.9 09/04/2017   PLT 272 09/04/2017    Lab Results  Component Value Date   CREATININE 1.03 09/04/2017   BUN 13 09/04/2017   NA 140 09/04/2017   K 4.2 09/04/2017   CL 105 09/04/2017   CO2 23 09/04/2017    Lab Results  Component Value Date   ALT 14 09/04/2017   AST 18 09/04/2017   ALKPHOS 111 10/08/2015   BILITOT 0.5 09/04/2017    Lab Results  Component Value Date   CHOL 272 (H) 09/04/2017   HDL 52 09/04/2017   LDLCALC 176 (H) 09/04/2017   TRIG 235 (H) 09/04/2017   CHOLHDL 5.2 (H) 09/04/2017   Lab Results  Component Value Date   LABRPR NON-REACTIVE 09/04/2017   HIV 1 RNA Quant (copies/mL)  Date Value  01/02/2018 51 (H)    09/04/2017 <20 DETECTED (A)  06/20/2017 <20 DETECTED (A)   CD4 T Cell Abs (/uL)  Date Value  01/02/2018 660  09/04/2017 670  10/08/2015 480     Problem List Items Addressed This Visit      High   Human  immunodeficiency virus (HIV) disease (HCC)    His infection was under very good control when seen last summer.  His adherence seems to have been much better since he was released from jail several years ago.  He will restart Genvoya and Prezista today and come back for lab work in 6 weeks.      Relevant Orders   T-helper cell (CD4)- ([facility] clinic only)   HIV-1 RNA quant-no reflex-bld   CBC   Comprehensive metabolic panel   RPR   Chronic hepatitis C virus infection (HCC)    His hepatitis C was cured following treatment last year.  We do not have any recent imaging of his abdomen.  I will order an ultrasound and see him back in 6 weeks.      Relevant Orders   US Abdomen Complete     Medium   CIGARETTE SMOKER    I encouraged him to make an attempt to quit smoking completely.           Cliffton AstersJohn Kenetha Cozza, MD [facility] [phone] pager   [phone] cell 07/05/2018, 9:46 AM

## 2018-07-05 NOTE — Assessment & Plan Note (Addendum)
I encouraged him to make an attempt to quit smoking completely.

## 2018-07-05 NOTE — Assessment & Plan Note (Addendum)
His hepatitis C was cured following treatment last year.  We do not have any recent imaging of his abdomen.  I am concerned that he may have cirrhosis complicated by esophageal varices.  I will consider adding low-dose beta-blocker therapy.  I will order an ultrasound and see him back in 6 weeks.

## 2018-07-05 NOTE — Assessment & Plan Note (Signed)
His infection was under very good control when seen last summer.  His adherence seems to have been much better since he was released from jail several years ago.  He will restart Genvoya and Prezista today and come back for lab work in 6 weeks.

## 2018-07-05 NOTE — Addendum Note (Signed)
Addended by: Gerarda Fraction on: 07/05/2018 11:27 AM   Modules accepted: Orders

## 2018-07-10 ENCOUNTER — Other Ambulatory Visit: Payer: Self-pay

## 2018-08-06 ENCOUNTER — Encounter: Payer: Self-pay | Admitting: Internal Medicine

## 2018-08-07 DIAGNOSIS — Z9081 Acquired absence of spleen: Secondary | ICD-10-CM | POA: Insufficient documentation

## 2018-08-07 MED ORDER — METOPROLOL TARTRATE 25 MG PO TABS: 25.0000 mg | ORAL_TABLET | Freq: Two times a day (BID) | ORAL | 5 refills | Status: DC

## 2018-08-07 NOTE — Addendum Note (Signed)
Addended by: Cliffton Asters on: 08/07/2018 12:53 PM   Modules accepted: Orders

## 2018-08-14 ENCOUNTER — Ambulatory Visit (INDEPENDENT_AMBULATORY_CARE_PROVIDER_SITE_OTHER): Payer: Self-pay | Admitting: Internal Medicine

## 2018-08-14 ENCOUNTER — Telehealth: Payer: Self-pay | Admitting: Pharmacy Technician

## 2018-08-14 ENCOUNTER — Encounter: Payer: Self-pay | Admitting: Internal Medicine

## 2018-08-14 ENCOUNTER — Ambulatory Visit
Admission: RE | Admit: 2018-08-14 | Discharge: 2018-08-14 | Disposition: A | Discharge status: Home / Self Care | Payer: Self-pay | Source: Ambulatory Visit | Attending: Internal Medicine | Admitting: Internal Medicine

## 2018-08-14 DIAGNOSIS — B182 Chronic viral hepatitis C: Secondary | ICD-10-CM

## 2018-08-14 DIAGNOSIS — B20 Human immunodeficiency virus [HIV] disease: Secondary | ICD-10-CM

## 2018-08-14 DIAGNOSIS — Z9081 Acquired absence of spleen: Secondary | ICD-10-CM

## 2018-08-14 NOTE — Progress Notes (Signed)
Patient Active Problem List   Diagnosis Date Noted  . Human immunodeficiency virus (HIV) disease (HCC) 07/03/2006    Priority: High  . Chronic hepatitis C virus infection (HCC) 07/03/2006    Priority: High  . Macrocytic anemia 05/15/2013    Priority: Medium  . Hyperglycemia 05/15/2013    Priority: Medium  . CIGARETTE SMOKER 07/03/2006    Priority: Medium  . 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 Poor dentition 10/27/2015  . Avascular necrosis of hip (HCC) 07/20/2012  . Chronic bronchitis (HCC) 06/18/2012  . HIP FRACTURE, LEFT 06/15/2010  . ATYPICAL MYCOBACTERIAL INFECTION 12/04/2008  . TENDINITIS, THUMB 05/20/2008  . Condyloma acuminatum 07/03/2006  . ERECTILE DYSFUNCTION 07/03/2006  . Polysubstance abuse (HCC) 07/03/2006  . HX, PERSONAL, TUBERCULOSIS 07/03/2006  . HEPATITIS B, HX OF 07/03/2006    Patient's Medications  New Prescriptions   No medications on file  Previous Medications   GENVOYA 150-150-200-10 MG TABS TABLET    TAKE 1 TABLET BY MOUTH DAILY WITH BREAKFAST   METOPROLOL TARTRATE (LOPRESSOR) 25 MG TABLET    Take 1 tablet (25 mg total) by mouth 2 (two) times daily.   ONDANSETRON (ZOFRAN-ODT) 4 MG DISINTEGRATING TABLET    DISSOLVE ONE TABLET BY MOUTH EVERY 8 HOURS AS NEEDED FOR NAUSEA AND VOMITING   PREZISTA 800 MG TABLET    TAKE 1 TABLET BY MOUTH DAILY WITH BREAKFAST  Modified Medications   No medications on file  Discontinued Medications   No medications on file    Subjective: [name] is in for his routine HIV follow-up visit.  He has had no problems obtaining, taking or tolerating his Genvoya or Prezista.  He denies missing doses.  He says that he only drinks an occasional sip of beer.  He says that he only smokes about 1 cigarette every 4 to 5 days.  Review of Systems: Review of Systems  Constitutional: Negative for chills, diaphoresis, fever and weight loss.  Respiratory: Negative for cough.   Cardiovascular: Negative for chest pain.    Gastrointestinal: Positive for abdominal pain. Negative for blood in stool, constipation, diarrhea, nausea and vomiting.  Psychiatric/Behavioral: Negative for depression.    Past Medical History:  Diagnosis Date  . Arthritis    left hip  . Bronchitis, chronic (HCC)    per office note 06/18/12 Dr  [name]  . Condyloma acuminatum   . Hepatitis    HX   B and C  . HIV disease (HCC)   . Pneumonia    x 2  . Substance abuse (HCC)    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,  ALCOHOL, CIGARETTE ABUSE  . Tuberculosis    per patient    Social History   Tobacco Use  . Smoking status: Current Some Day Smoker    Packs/day: 0.20    Years: 50.00    Pack years: 10.00    Types: Cigarettes  . Smokeless tobacco: Never Used  Substance Use Topics  . Alcohol use: Yes    Alcohol/week: 42.0 standard drinks    Types: 42 Standard drinks or equivalent per week    Comment: 6-12oz beers  . Drug use: Yes    Frequency: 3.0 times per week    Types: "Crack" cocaine    Comment: last smoked crack 3 days ago - 10/27/15 pt stated that it was a month ago he last used    No family history on file.  No Known Allergies  Health Maintenance  Topic Date Due  .  TETANUS/TDAP  03/16/1970  . COLONOSCOPY  03/16/2001  . INFLUENZA VACCINE  Completed  . Hepatitis C Screening  Completed  . PNA vac Low Risk Adult  Completed    Objective:  Vitals:   08/14/18 0848  BP: 124/77  Pulse: 60  Temp: 98.3 F (36.8 C)  Weight: 192 lb (87.1 kg)   Body mass index is 26.78 kg/m.  Physical Exam Constitutional:      Comments: He is in good spirits.  HENT:     Mouth/Throat:     Pharynx: No oropharyngeal exudate.  Cardiovascular:     Rate and Rhythm: Normal rate and regular rhythm.     Heart sounds: No murmur.  Pulmonary:     Effort: Pulmonary effort is normal.     Breath sounds: Normal breath sounds.  Abdominal:     Comments: His abdomen is distended.  He may have ascites.  I can feel a firm liver edge 2 fingerbreadths  below the costal margin on deep inspiration.  He has some mild left upper quadrant pain with palpation.  Skin:    Findings: No rash.  Psychiatric:        Mood and Affect: Mood normal.     Lab Results Lab Results  Component Value Date   WBC 9.2 09/04/2017   HGB 14.8 09/04/2017   HCT 44.5 09/04/2017   MCV 85.9 09/04/2017   PLT 272 09/04/2017    Lab Results  Component Value Date   CREATININE 1.03 09/04/2017   BUN 13 09/04/2017   NA 140 09/04/2017   K 4.2 09/04/2017   CL 105 09/04/2017   CO2 23 09/04/2017    Lab Results  Component Value Date   ALT 14 09/04/2017   AST 18 09/04/2017   ALKPHOS 111 10/08/2015   BILITOT 0.5 09/04/2017    Lab Results  Component Value Date   CHOL 272 (H) 09/04/2017   HDL 52 09/04/2017   LDLCALC 176 (H) 09/04/2017   TRIG 235 (H) 09/04/2017   CHOLHDL 5.2 (H) 09/04/2017   Lab Results  Component Value Date   LABRPR NON-REACTIVE 09/04/2017   HIV 1 RNA Quant (copies/mL)  Date Value  01/02/2018 51 (H)  09/04/2017 <20 DETECTED (A)  06/20/2017 <20 DETECTED (A)   CD4 T Cell Abs (/uL)  Date Value  01/02/2018 660  09/04/2017 670  10/08/2015 480     Problem List Items Addressed This Visit      High   Human immunodeficiency virus (HIV) disease (HCC)    His adherence has been much better in the past few years.  Unfortunately I discovered today that he failed to get a Medicare part D prescription drug plan and his ADAP has now expired.  I reviewed the situation with our pharmacist.  We are not sure if we will be able to get him any medication assistance going forward.  He will follow-up in 3 weeks.  He will get blood work today.      Relevant Orders   T-helper cell (CD4)- ([facility] clinic only)   HIV-1 RNA quant-no reflex-bld   CBC   Comprehensive metabolic panel   RPR   Chronic hepatitis C virus infection (HCC)    He had an abdominal ultrasound this morning.  The report is still pending.  He will follow-up in 3 weeks.         Unprioritized   Status post splenectomy        Cliffton Asters, MD [facility] [phone] pager   [phone] cell 08/14/2018, 9:21 AM

## 2018-08-14 NOTE — Assessment & Plan Note (Signed)
His adherence has been much better in the past few years.  Unfortunately I discovered today that he failed to get a Medicare part D prescription drug plan and his ADAP has now expired.  I reviewed the situation with our pharmacist.  We are not sure if we will be able to get him any medication assistance going forward.  He will follow-up in 3 weeks.  He will get blood work today.

## 2018-08-14 NOTE — Telephone Encounter (Signed)
[facility] Patient Advocate Encounter  Completed and sent [employer] Advancing Access application for Genvoya for this patient who is uninsured.  He has had patient assistance from them before, so unsure if will grant again.  He must get Medicare part D insurance coverage.  Gave the phone number for [employer] to help him find affordable insurance.  This encounter will be updated until final determination.   Netty Starring. Dimas Aguas CPhT Specialty Pharmacy Patient [facility] for Infectious Disease Phone: [phone] Fax:  [phone]

## 2018-08-14 NOTE — Assessment & Plan Note (Signed)
He had an abdominal ultrasound this morning.  The report is still pending.  He will follow-up in 3 weeks.

## 2018-08-15 LAB — T-HELPER CELL (CD4) - (RCID CLINIC ONLY)
CD4 % Helper T Cell: 16 % — ABNORMAL LOW (ref 33–55)
CD4 T Cell Abs: 550 /uL (ref 400–2700)

## 2018-08-16 ENCOUNTER — Other Ambulatory Visit: Payer: Self-pay | Admitting: Internal Medicine

## 2018-08-16 DIAGNOSIS — B182 Chronic viral hepatitis C: Secondary | ICD-10-CM

## 2018-08-16 MED ORDER — METOPROLOL TARTRATE 50 MG PO TABS: 50.0000 mg | ORAL_TABLET | Freq: Two times a day (BID) | ORAL | 3 refills | Status: DC

## 2018-08-17 LAB — RPR: RPR: NONREACTIVE

## 2018-08-17 LAB — HIV-1 RNA QUANT-NO REFLEX-BLD
HIV 1 RNA Quant: <20 copies/mL — AB
HIV-1 RNA Quant, Log: <1.3 Log copies/mL — AB

## 2018-08-17 LAB — COMPREHENSIVE METABOLIC PANEL
AG Ratio: 1.5 (calc) (ref 1.0–2.5)
ALT: 18 U/L (ref 9–46)
AST: 25 U/L (ref 10–35)
Albumin: 4.6 g/dL (ref 3.6–5.1)
Alkaline phosphatase (APISO): 65 U/L (ref 35–144)
BUN: 14 mg/dL (ref 7–25)
CHLORIDE: 104 mmol/L (ref 98–110)
CO2: 26 mmol/L (ref 20–32)
Calcium: 9.9 mg/dL (ref 8.6–10.3)
Creat: 1.21 mg/dL (ref 0.70–1.25)
GLOBULIN: 3 g/dL (ref 1.9–3.7)
Glucose, Bld: 107 mg/dL — ABNORMAL HIGH (ref 65–99)
Potassium: 4.7 mmol/L (ref 3.5–5.3)
SODIUM: 139 mmol/L (ref 135–146)
Total Bilirubin: 0.6 mg/dL (ref 0.2–1.2)
Total Protein: 7.6 g/dL (ref 6.1–8.1)

## 2018-08-17 LAB — CBC
HCT: 45 % (ref 38.5–50.0)
Hemoglobin: 15.1 g/dL (ref 13.2–17.1)
MCH: 29.1 pg (ref 27.0–33.0)
MCHC: 33.6 g/dL (ref 32.0–36.0)
MCV: 86.7 fL (ref 80.0–100.0)
MPV: 10.7 fL (ref 7.5–12.5)
Platelets: 299 10*3/uL (ref 140–400)
RBC: 5.19 10*6/uL (ref 4.20–5.80)
RDW: 12.9 % (ref 11.0–15.0)
WBC: 9.9 10*3/uL (ref 3.8–10.8)

## 2018-08-20 NOTE — Telephone Encounter (Signed)
[facility] Patient Advocate Encounter  Completed and sent [employer] Advancing Access application for Genvoya for this patient who is uninsured.    Pharmacy is aware.   He was previously approved through 04/03/2019  BIN   [id number] PCN  [id number] GRP  [id number] ID    [id number]  Kathie Rhodes E. Dimas Aguas CPhT Specialty Pharmacy Patient [facility] for Infectious Disease Phone: [phone] Fax:  [phone]

## 2018-09-05 ENCOUNTER — Telehealth: Payer: Self-pay | Admitting: *Deleted

## 2018-09-05 NOTE — Telephone Encounter (Signed)
Per Dr [name] called patient to move his appt to July. Had to leave a message.

## 2018-09-06 ENCOUNTER — Ambulatory Visit: Payer: Self-pay | Admitting: Internal Medicine

## 2018-10-24 ENCOUNTER — Other Ambulatory Visit: Payer: Self-pay | Admitting: Internal Medicine

## 2018-10-24 DIAGNOSIS — B20 Human immunodeficiency virus [HIV] disease: Secondary | ICD-10-CM

## 2018-10-26 ENCOUNTER — Telehealth: Payer: Self-pay | Admitting: *Deleted

## 2018-10-26 DIAGNOSIS — B20 Human immunodeficiency virus [HIV] disease: Secondary | ICD-10-CM

## 2018-10-26 NOTE — Telephone Encounter (Signed)
Patient walked in, asked to speak with a nurse to get paperwork filled out. Paperwork is regarding his medical need for personal care services. Patient states he "just heard about this program from his sister and wants it."  RN offered to have Ambre work with him again for an assessment of need, bringing up any deficits found with Dr Cathie Olden.  Patient agreeable. Will ask Dr [name] and Lind Covert if appropriate. Andree Coss, RN

## 2018-10-30 ENCOUNTER — Telehealth: Payer: Self-pay | Admitting: *Deleted

## 2018-10-30 NOTE — Telephone Encounter (Addendum)
Call placed to Mr [name] today in reference to his PCS request. Msg left asking for a return call, I would like to arrange a time to meet with Mr [name] so we can complete his request.

## 2018-10-30 NOTE — Telephone Encounter (Signed)
[facility] Patient Advocate Encounter  Patient called this afternoon concerned because he was unable to get his medications from [facility] on May 22nd. They stated to him that there was no insurance information on file and would not fill the medication. Called [facility] and provided them with the below billing information and billing information for Prezista [facility] [address]). They were able to fill the Prezista for $0.00 and calling [employer] to get the [place] claim approved. Patient is aware of the update and will pick up medication 05/27 at [facility].

## 2018-11-01 ENCOUNTER — Telehealth: Payer: Self-pay | Admitting: Internal Medicine

## 2018-11-01 NOTE — Telephone Encounter (Signed)
COVID-19 Pre-Screening Questions: ° °Do you currently have a fever (>100 °F), chills or unexplained body aches? No  ° °Are you currently experiencing new cough, shortness of breath, sore throat, runny nose? No  °•  °Have you recently travelled outside the state of [place] in the last 14 days? No  °•  °Have you been in contact with someone that is currently pending confirmation of Covid19 testing or has been confirmed to have the Covid19 virus?  No  °

## 2018-11-05 ENCOUNTER — Ambulatory Visit: Payer: Self-pay | Admitting: Internal Medicine

## 2019-05-13 ENCOUNTER — Telehealth: Payer: Self-pay

## 2019-05-13 ENCOUNTER — Other Ambulatory Visit: Payer: Self-pay | Admitting: Internal Medicine

## 2019-05-13 DIAGNOSIS — B20 Human immunodeficiency virus [HIV] disease: Secondary | ICD-10-CM

## 2019-05-13 NOTE — Telephone Encounter (Signed)
-----   Message from [place], [place] sent at 05/13/2019  4:20 PM EST ----- Regarding: RE: expired HMAP/RW Called patient to do his application over the phone. The application is incomplete wanting on income and insurance information. Patient states he has 3 pills left. He will come to the office this week to bring me the information  ----- Message ----- From: Eugenia Mcalpine, LPN Sent: 31/09/9700   3:51 PM EST To: Mylinda Latina, NT Subject: expired HMAP/RW                                Please give patient call to renew. He flagged for medication refill.

## 2019-05-14 ENCOUNTER — Other Ambulatory Visit: Payer: Medicare Other

## 2019-05-14 ENCOUNTER — Other Ambulatory Visit: Payer: Self-pay

## 2019-05-14 ENCOUNTER — Encounter: Payer: Self-pay | Admitting: Internal Medicine

## 2019-05-14 DIAGNOSIS — B20 Human immunodeficiency virus [HIV] disease: Secondary | ICD-10-CM

## 2019-05-14 NOTE — Telephone Encounter (Signed)
We will contact the patient about his coverage and ensuring that he does not miss any doses due to coverage gap.

## 2019-05-15 LAB — T-HELPER CELL (CD4) - (RCID CLINIC ONLY)
CD4 % Helper T Cell: 16 % — ABNORMAL LOW (ref 33–65)
CD4 T Cell Abs: 723 /uL (ref 400–1790)

## 2019-05-16 ENCOUNTER — Other Ambulatory Visit: Payer: Self-pay | Admitting: *Deleted

## 2019-05-25 LAB — COMPREHENSIVE METABOLIC PANEL
AG Ratio: 1.7 (calc) (ref 1.0–2.5)
ALT: 19 U/L (ref 9–46)
AST: 19 U/L (ref 10–35)
Albumin: 4.2 g/dL (ref 3.6–5.1)
Alkaline phosphatase (APISO): 66 U/L (ref 35–144)
BUN: 21 mg/dL (ref 7–25)
CO2: 25 mmol/L (ref 20–32)
Calcium: 9.3 mg/dL (ref 8.6–10.3)
Chloride: 108 mmol/L (ref 98–110)
Creat: 1.17 mg/dL (ref 0.70–1.25)
Globulin: 2.5 g/dL (calc) (ref 1.9–3.7)
Glucose, Bld: 92 mg/dL (ref 65–99)
Potassium: 4.7 mmol/L (ref 3.5–5.3)
Sodium: 143 mmol/L (ref 135–146)
Total Bilirubin: 0.3 mg/dL (ref 0.2–1.2)
Total Protein: 6.7 g/dL (ref 6.1–8.1)

## 2019-05-25 LAB — CBC
HCT: 44.7 % (ref 38.5–50.0)
Hemoglobin: 14.7 g/dL (ref 13.2–17.1)
MCH: 29.1 pg (ref 27.0–33.0)
MCHC: 32.9 g/dL (ref 32.0–36.0)
MCV: 88.3 fL (ref 80.0–100.0)
MPV: 10.7 fL (ref 7.5–12.5)
Platelets: 269 10*3/uL (ref 140–400)
RBC: 5.06 10*6/uL (ref 4.20–5.80)
RDW: 12.7 % (ref 11.0–15.0)
WBC: 10.3 10*3/uL (ref 3.8–10.8)

## 2019-05-25 LAB — HIV-1 RNA QUANT-NO REFLEX-BLD
HIV 1 RNA Quant: 44 copies/mL — ABNORMAL HIGH
HIV-1 RNA Quant, Log: 1.64 Log copies/mL — ABNORMAL HIGH

## 2019-05-25 LAB — RPR: RPR Ser Ql: NONREACTIVE

## 2019-05-27 DIAGNOSIS — K7460 Unspecified cirrhosis of liver: Secondary | ICD-10-CM | POA: Insufficient documentation

## 2019-05-28 ENCOUNTER — Telehealth: Payer: Self-pay | Admitting: Pharmacy Technician

## 2019-05-28 ENCOUNTER — Ambulatory Visit (INDEPENDENT_AMBULATORY_CARE_PROVIDER_SITE_OTHER): Payer: Medicare Other | Admitting: Internal Medicine

## 2019-05-28 ENCOUNTER — Other Ambulatory Visit: Payer: Self-pay

## 2019-05-28 ENCOUNTER — Encounter: Payer: Self-pay | Admitting: Internal Medicine

## 2019-05-28 DIAGNOSIS — K7469 Other cirrhosis of liver: Secondary | ICD-10-CM | POA: Diagnosis present

## 2019-05-28 DIAGNOSIS — Z23 Encounter for immunization: Secondary | ICD-10-CM | POA: Diagnosis not present

## 2019-05-28 DIAGNOSIS — B20 Human immunodeficiency virus [HIV] disease: Secondary | ICD-10-CM

## 2019-05-28 NOTE — Progress Notes (Signed)
Patient Active Problem List   Diagnosis Date Noted   Human immunodeficiency virus (HIV) disease ([place]) 07/03/2006    Priority: High   Chronic hepatitis C virus infection ([place]) 07/03/2006    Priority: High   Macrocytic anemia 05/15/2013    Priority: Medium   Hyperglycemia 05/15/2013    Priority: Medium   CIGARETTE SMOKER 07/03/2006    Priority: Medium   Cirrhosis ([place]) 05/27/2019   Status post splenectomy 08/07/2018   Poor dentition 10/27/2015   Avascular necrosis of hip ([place]) 07/20/2012   Chronic bronchitis ([place]) 06/18/2012   HIP FRACTURE, LEFT 06/15/2010   ATYPICAL MYCOBACTERIAL INFECTION 12/04/2008   TENDINITIS, THUMB 05/20/2008   Condyloma acuminatum 07/03/2006   ERECTILE DYSFUNCTION 07/03/2006   Polysubstance abuse ([place]) 07/03/2006   HX, PERSONAL, TUBERCULOSIS 07/03/2006   HEPATITIS B, HX OF 07/03/2006    Patient's Medications  New Prescriptions   No medications on file  Previous Medications   GENVOYA 150-150-200-10 MG TABS TABLET    TAKE 1 TABLET BY MOUTH EVERY DAY WITH BREAKFAST   METOPROLOL TARTRATE (LOPRESSOR) 50 MG TABLET    Take 1 tablet (50 mg total) by mouth 2 (two) times daily.   ONDANSETRON (ZOFRAN-ODT) 4 MG DISINTEGRATING TABLET    DISSOLVE ONE TABLET BY MOUTH EVERY 8 HOURS AS NEEDED FOR NAUSEA AND VOMITING   PREZISTA 800 MG TABLET    TAKE 1 TABLET BY MOUTH EVERY DAY WITH BREAKFAST  Modified Medications   No medications on file  Discontinued Medications   No medications on file    Subjective: [name] is in for his routine HIV follow-up visit.  He has not had any problems obtaining his Genvoya and Prezista until he went to the pharmacy 1 week ago and was told he would have a $9 co-pay.  He could not afford it and has been out of both medications for the last week.  He does not think he was missing doses before he ran out.  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  He enjoys having some money in  his pocket.  Review of Systems: Review of Systems  Constitutional: Negative for fever.  Respiratory: Negative for cough and shortness of breath.   Cardiovascular: Negative for chest pain.  Gastrointestinal: Positive for abdominal pain. Negative for constipation, diarrhea, nausea and vomiting.    Past Medical History:  Diagnosis Date   Arthritis    left hip   Bronchitis, chronic ([place])    per office note 06/18/12 Dr  Megan Salon   Condyloma acuminatum    Hepatitis    HX   B and C   HIV disease ([place])    Pneumonia    x 2   Substance abuse ([place])    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,  ALCOHOL, CIGARETTE ABUSE   Tuberculosis    per patient    Social History   Tobacco Use   Smoking status: Current Some Day Smoker    Packs/day: 0.20    Years: 50.00    Pack years: 10.00    Types: Cigarettes   Smokeless tobacco: Never Used  Substance Use Topics   Alcohol use: Yes    Alcohol/week: 42.0 standard drinks    Types: 42 Standard drinks or equivalent per week    Comment: 6-12oz beers   Drug use: Yes    Frequency: 3.0 times per week    Types: "Crack" cocaine    Comment: last  smoked crack 3 days ago - 10/27/15 pt stated that it was a month ago he last used    No family history on file.  No Known Allergies  Health Maintenance  Topic Date Due   TETANUS/TDAP  03/16/1970   COLONOSCOPY  03/16/2001   INFLUENZA VACCINE  01/05/2019   Hepatitis C Screening  Completed   PNA vac Low Risk Adult  Completed    Objective:  Vitals:   05/28/19 0951  BP: 135/75  Pulse: 76  Temp: 98.6 F (37 C)  TempSrc: Oral  Weight: 193 lb (87.5 kg)   Body mass index is 26.92 kg/m.  Physical Exam Constitutional:      Comments: He is pleasant and happy.  Cardiovascular:     Rate and Rhythm: Normal rate and regular rhythm.     Heart sounds: No murmur.  Pulmonary:     Effort: Pulmonary effort is normal.     Breath sounds: Normal breath sounds.  Abdominal:     General: There is  distension.     Palpations: Abdomen is soft.     Tenderness: There is abdominal tenderness.     Comments: Mild diffuse tenderness with palpation.  Psychiatric:        Mood and Affect: Mood normal.     Lab Results Lab Results  Component Value Date   WBC 10.3 05/14/2019   HGB 14.7 05/14/2019   HCT 44.7 05/14/2019   MCV 88.3 05/14/2019   PLT 269 05/14/2019    Lab Results  Component Value Date   CREATININE 1.17 05/14/2019   BUN 21 05/14/2019   NA 143 05/14/2019   K 4.7 05/14/2019   CL 108 05/14/2019   CO2 25 05/14/2019    Lab Results  Component Value Date   ALT 19 05/14/2019   AST 19 05/14/2019   ALKPHOS 111 10/08/2015   BILITOT 0.3 05/14/2019    Lab Results  Component Value Date   CHOL 272 (H) 09/04/2017   HDL 52 09/04/2017   LDLCALC 176 (H) 09/04/2017   TRIG 235 (H) 09/04/2017   CHOLHDL 5.2 (H) 09/04/2017   Lab Results  Component Value Date   LABRPR NON-REACTIVE 05/14/2019   HIV 1 RNA Quant (copies/mL)  Date Value  05/14/2019 44 (H)  08/14/2018 <20 DETECTED (A)  01/02/2018 51 (H)   CD4 T Cell Abs (/uL)  Date Value  05/14/2019 723  08/14/2018 550  01/02/2018 660     Problem List Items Addressed This Visit      High   Human immunodeficiency virus (HIV) disease (HCC)    His infection is under very good long-term control.  I had him meet with our pharmacy staff today to sort out why he suddenly has a co-pay.  I asked him to always call us right away if he has any difficulty obtaining his medications.  He received his influenza vaccine today.  He will follow-up after lab work in 6 months.      Relevant Orders   6 month CD4   6 month VL   6 month CBC   6 month CMP   6 month RPR     Unprioritized   Cirrhosis (HCC)        Cliffton Asters, MD [facility] for Infectious Disease [facility] [phone] pager   [phone] cell 05/28/2019, 10:23 AM

## 2019-05-28 NOTE — Telephone Encounter (Signed)
[facility] Patient Advocate Encounter   I was successful in securing patient a $7500 grant from [employer] (PAF) to provide copayment coverage for [place]. This will make the out of pocket cost $0.     I have spoken with the patient and he will take the bus today to [facility] to pick up his medications. He was having trouble paying the $9.00 copays.    The billing information is as follows: RxBin: [id number] PCN: [id number] Member ID: [id number] Group ID: [id number] Dates of Eligibility: 05/28/2019 through 05/27/2020  Patient knows to call the office with questions or concerns.  Bartholomew Crews, CPhT Specialty Pharmacy Patient [facility] for Infectious Disease Phone: [phone] Fax: [phone] 05/28/2019 11:55 AM

## 2019-05-28 NOTE — Telephone Encounter (Signed)
[facility] Patient Advocate Encounter  When I spoke to [facility], they indicated that the patient had already picked up the medication on 05/17/2019 and paid the copays. Tried to get in touch with the patient through his sister's cell phone but was unable to reach him. He was planning on coming to clinic today via the bus to bring paperwork but wanted to alert him that was no longer necessary. If patient calls back, alert him that everything is taken care of and his medications will now be $0 until 05/27/2020.

## 2019-05-28 NOTE — Assessment & Plan Note (Signed)
His infection is under very good long-term control.  I had him meet with our pharmacy staff today to sort out why he suddenly has a co-pay.  I asked him to always call us right away if he has any difficulty obtaining his medications.  He received his influenza vaccine today.  He will follow-up after lab work in 6 months.

## 2019-06-16 ENCOUNTER — Other Ambulatory Visit: Payer: Self-pay | Admitting: Internal Medicine

## 2019-06-16 DIAGNOSIS — B20 Human immunodeficiency virus [HIV] disease: Secondary | ICD-10-CM

## 2019-06-17 ENCOUNTER — Other Ambulatory Visit: Payer: Self-pay | Admitting: Internal Medicine

## 2019-06-17 DIAGNOSIS — B20 Human immunodeficiency virus [HIV] disease: Secondary | ICD-10-CM

## 2019-06-18 ENCOUNTER — Telehealth: Payer: Self-pay | Admitting: Pharmacy Technician

## 2019-06-18 NOTE — Telephone Encounter (Signed)
[facility] Patient Advocate Encounter  Patient has SPAP to cover his copays.  He left a voicemail stating he has copays and he asked [place] to fix it.  I reached out to [employer] to transfer his RX's from another [facility] (which is not an [facility] pharmacy) to them because they are an SPAP pharmacy.  They did, but cannot find the SPAP number, so I messaged Don'Quashia Martin at the clinic asking her to get the number and call it into the pharmacy so they can process.  Netty Starring. Dimas Aguas CPhT Specialty Pharmacy Patient [facility] for Infectious Disease Phone: [phone] Fax:  [phone]

## 2019-07-15 ENCOUNTER — Other Ambulatory Visit: Payer: Self-pay

## 2019-07-15 DIAGNOSIS — B20 Human immunodeficiency virus [HIV] disease: Secondary | ICD-10-CM

## 2019-07-15 MED ORDER — GENVOYA 150-150-200-10 MG PO TABS: ORAL_TABLET | ORAL | 5 refills | Status: DC

## 2019-08-02 ENCOUNTER — Ambulatory Visit: Payer: Medicare Other

## 2019-11-01 ENCOUNTER — Other Ambulatory Visit: Payer: Self-pay | Admitting: Internal Medicine

## 2019-11-01 DIAGNOSIS — B20 Human immunodeficiency virus [HIV] disease: Secondary | ICD-10-CM

## 2019-11-12 ENCOUNTER — Other Ambulatory Visit: Payer: Medicare Other

## 2019-12-11 ENCOUNTER — Other Ambulatory Visit: Payer: Self-pay

## 2019-12-11 ENCOUNTER — Ambulatory Visit (INDEPENDENT_AMBULATORY_CARE_PROVIDER_SITE_OTHER): Payer: Medicare Other | Admitting: Internal Medicine

## 2019-12-11 ENCOUNTER — Encounter: Payer: Self-pay | Admitting: Internal Medicine

## 2019-12-11 DIAGNOSIS — F17200 Nicotine dependence, unspecified, uncomplicated: Secondary | ICD-10-CM

## 2019-12-11 DIAGNOSIS — M25569 Pain in unspecified knee: Secondary | ICD-10-CM | POA: Insufficient documentation

## 2019-12-11 DIAGNOSIS — B20 Human immunodeficiency virus [HIV] disease: Secondary | ICD-10-CM | POA: Diagnosis not present

## 2019-12-11 DIAGNOSIS — M25562 Pain in left knee: Secondary | ICD-10-CM

## 2019-12-11 NOTE — Assessment & Plan Note (Signed)
I congratulated him on quitting smoking. 

## 2019-12-11 NOTE — Progress Notes (Signed)
Patient Active Problem List   Diagnosis Date Noted  . Human immunodeficiency virus (HIV) disease (HCC) 07/03/2006    Priority: High  . Chronic hepatitis C virus infection (HCC) 07/03/2006    Priority: High  . Macrocytic anemia 05/15/2013    Priority: Medium  . Hyperglycemia 05/15/2013    Priority: Medium  . CIGARETTE SMOKER 07/03/2006    Priority: Medium  . Knee pain 12/11/2019  . Cirrhosis (HCC) 05/27/2019  . 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 Poor dentition 10/27/2015  . Avascular necrosis of hip (HCC) 07/20/2012  . Chronic bronchitis (HCC) 06/18/2012  . HIP FRACTURE, LEFT 06/15/2010  . ATYPICAL MYCOBACTERIAL INFECTION 12/04/2008  . TENDINITIS, THUMB 05/20/2008  . Condyloma acuminatum 07/03/2006  . ERECTILE DYSFUNCTION 07/03/2006  . Polysubstance abuse (HCC) 07/03/2006  . HX, PERSONAL, TUBERCULOSIS 07/03/2006  . HEPATITIS B, HX OF 07/03/2006    Patient's Medications  New Prescriptions   No medications on file  Previous Medications   ELVITEGRAVIR-COBICISTAT-EMTRICITABINE-TENOFOVIR (GENVOYA) 150-150-200-10 MG TABS TABLET    TAKE 1 TABLET BY MOUTH EVERY DAY WITH BREAKFAST   METOPROLOL TARTRATE (LOPRESSOR) 50 MG TABLET    Take 1 tablet (50 mg total) by mouth 2 (two) times daily.   ONDANSETRON (ZOFRAN-ODT) 4 MG DISINTEGRATING TABLET    DISSOLVE ONE TABLET BY MOUTH EVERY 8 HOURS AS NEEDED FOR NAUSEA AND VOMITING   PREZISTA 800 MG TABLET    TAKE 1 TABLET BY MOUTH EVERY DAY WITH BREAKFAST  Modified Medications   No medications on file  Discontinued Medications   No medications on file    Subjective: [name] is in for his routine HIV follow-up visit.  He has not had any problems obtaining, taking or tolerating his Genvoya and Prezista.  He estimates that he has missed about 5 doses last 6 months.  He normally takes it in the morning and frequently forgotten to take it and left home only to remember the following day.  He is using a pillbox.  He has had some sinus  congestion recently and quit smoking cigarettes 2 weeks ago.  He is scheduled to get his first Covid vaccine soon.  He says that he tries to jog sign 3-day been having some left knee pain recently.  Review of Systems: Review of Systems  Constitutional: Negative for chills, diaphoresis, fever, malaise/fatigue and weight loss.  HENT: Positive for congestion. Negative for sore throat.   Respiratory: Negative for cough, sputum production and shortness of breath.   Cardiovascular: Negative for chest pain.  Gastrointestinal: Negative for abdominal pain, diarrhea, heartburn, nausea and vomiting.  Genitourinary: Negative for dysuria and frequency.  Musculoskeletal: Positive for joint pain. Negative for myalgias.  Skin: Negative for rash.  Neurological: Negative for dizziness and headaches.  Psychiatric/Behavioral: Negative for depression and substance abuse. The patient is not nervous/anxious.     Past Medical History:  Diagnosis Date  . Arthritis    left hip  . Bronchitis, chronic (HCC)    per office note 06/18/12 Dr  [name]  . Condyloma acuminatum   . Hepatitis    HX   B and C  . HIV disease (HCC)   . Pneumonia    x 2  . Substance abuse (HCC)    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,  ALCOHOL, CIGARETTE ABUSE  . Tuberculosis    per patient    Social History   Tobacco Use  . Smoking status: Current Some Day Smoker    Packs/day: 0.20  Years: 50.00    Pack years: 10.00    Types: Cigarettes  . Smokeless tobacco: Never Used  Substance Use Topics  . Alcohol use: Yes    Alcohol/week: 42.0 standard drinks    Types: 42 Standard drinks or equivalent per week    Comment: 6-12oz beers  . Drug use: Yes    Frequency: 3.0 times per week    Types: "Crack" cocaine    Comment: last smoked crack 3 days ago - 10/27/15 pt stated that it was a month ago he last used    No family history on file.  No Known Allergies  Health Maintenance  Topic Date Due  . COVID-19 Vaccine (1) Never done  .  TETANUS/TDAP  Never done  . COLONOSCOPY  Never done  . INFLUENZA VACCINE  01/05/2020  . Hepatitis C Screening  Completed  . PNA vac Low Risk Adult  Completed    Objective:  Vitals:   12/11/19 1438  BP: 135/80  Pulse: 76  Temp: 98.2 F (36.8 C)  TempSrc: Oral  Weight: 179 lb (81.2 kg)   Body mass index is 24.97 kg/m.  Physical Exam Constitutional:      Comments: He is in good spirits.  HENT:     Mouth/Throat:     Pharynx: No oropharyngeal exudate.  Eyes:     Conjunctiva/sclera: Conjunctivae normal.  Cardiovascular:     Rate and Rhythm: Normal rate and regular rhythm.     Heart sounds: No murmur heard.   Pulmonary:     Effort: Pulmonary effort is normal.     Breath sounds: Normal breath sounds.  Abdominal:     General: There is distension.     Palpations: Abdomen is soft. There is no mass.     Tenderness: There is no abdominal tenderness.  Musculoskeletal:        General: No swelling or tenderness. Normal range of motion.  Skin:    Findings: No rash.  Neurological:     Mental Status: He is alert and oriented to person, place, and time.  Psychiatric:        Mood and Affect: Mood normal.     Lab Results Lab Results  Component Value Date   WBC 10.3 05/14/2019   HGB 14.7 05/14/2019   HCT 44.7 05/14/2019   MCV 88.3 05/14/2019   PLT 269 05/14/2019    Lab Results  Component Value Date   CREATININE 1.17 05/14/2019   BUN 21 05/14/2019   NA 143 05/14/2019   K 4.7 05/14/2019   CL 108 05/14/2019   CO2 25 05/14/2019    Lab Results  Component Value Date   ALT 19 05/14/2019   AST 19 05/14/2019   ALKPHOS 111 10/08/2015   BILITOT 0.3 05/14/2019    Lab Results  Component Value Date   CHOL 272 (H) 09/04/2017   HDL 52 09/04/2017   LDLCALC 176 (H) 09/04/2017   TRIG 235 (H) 09/04/2017   CHOLHDL 5.2 (H) 09/04/2017   Lab Results  Component Value Date   LABRPR NON-REACTIVE 05/14/2019   HIV 1 RNA Quant (copies/mL)  Date Value  05/14/2019 44 (H)    08/14/2018 <20 DETECTED (A)  01/02/2018 51 (H)   CD4 T Cell Abs (/uL)  Date Value  05/14/2019 723  08/14/2018 550  01/02/2018 660     Problem List Items Addressed This Visit      High   Human immunodeficiency virus (HIV) disease (HCC)    His infection has been under good  long-term control.  He will continue his current regimen and get lab work today.  He will follow-up in 6 months.      Relevant Orders   T-helper cell (CD4)- ([facility] clinic only)   HIV-1 RNA quant-no reflex-bld   CBC   Comprehensive metabolic panel   RPR     Medium   CIGARETTE SMOKER    I congratulated him on quitting smoking.        Unprioritized   Knee pain    He has no unusual swelling, redness or warmth of his knee.  I instructed him to try over-the-counter acetaminophen.           Cliffton Asters, MD [facility] for Infectious Disease [facility] [phone] pager   [phone] cell 12/11/2019, 2:58 PM

## 2019-12-11 NOTE — Assessment & Plan Note (Signed)
His infection has been under good long-term control.  He will continue his current regimen and get lab work today.  He will follow-up in 6 months.

## 2019-12-11 NOTE — Assessment & Plan Note (Signed)
He has no unusual swelling, redness or warmth of his knee.  I instructed him to try over-the-counter acetaminophen.

## 2019-12-12 LAB — T-HELPER CELL (CD4) - (RCID CLINIC ONLY)
CD4 % Helper T Cell: 19 % — ABNORMAL LOW (ref 33–65)
CD4 T Cell Abs: 571 /uL (ref 400–1790)

## 2019-12-16 LAB — CBC
HCT: 50.4 % — ABNORMAL HIGH (ref 38.5–50.0)
Hemoglobin: 15.6 g/dL (ref 13.2–17.1)
MCH: 28.1 pg (ref 27.0–33.0)
MCHC: 31 g/dL — ABNORMAL LOW (ref 32.0–36.0)
MCV: 90.6 fL (ref 80.0–100.0)
MPV: 10.9 fL (ref 7.5–12.5)
Platelets: 282 10*3/uL (ref 140–400)
RBC: 5.56 10*6/uL (ref 4.20–5.80)
RDW: 12.3 % (ref 11.0–15.0)
WBC: 8.8 10*3/uL (ref 3.8–10.8)

## 2019-12-16 LAB — COMPREHENSIVE METABOLIC PANEL
AG Ratio: 1.5 (calc) (ref 1.0–2.5)
ALT: 21 U/L (ref 9–46)
AST: 20 U/L (ref 10–35)
Albumin: 4.3 g/dL (ref 3.6–5.1)
Alkaline phosphatase (APISO): 71 U/L (ref 35–144)
BUN: 19 mg/dL (ref 7–25)
CO2: 26 mmol/L (ref 20–32)
Calcium: 9.8 mg/dL (ref 8.6–10.3)
Chloride: 107 mmol/L (ref 98–110)
Creat: 1.07 mg/dL (ref 0.70–1.25)
Globulin: 2.9 g/dL (calc) (ref 1.9–3.7)
Glucose, Bld: 90 mg/dL (ref 65–99)
Potassium: 4.8 mmol/L (ref 3.5–5.3)
Sodium: 140 mmol/L (ref 135–146)
Total Bilirubin: 0.4 mg/dL (ref 0.2–1.2)
Total Protein: 7.2 g/dL (ref 6.1–8.1)

## 2019-12-16 LAB — RPR: RPR Ser Ql: NONREACTIVE

## 2019-12-16 LAB — HIV-1 RNA QUANT-NO REFLEX-BLD
HIV 1 RNA Quant: 41 copies/mL — ABNORMAL HIGH
HIV-1 RNA Quant, Log: 1.61 Log copies/mL — ABNORMAL HIGH

## 2020-01-10 ENCOUNTER — Other Ambulatory Visit: Payer: Self-pay

## 2020-01-10 ENCOUNTER — Other Ambulatory Visit: Payer: Self-pay | Admitting: Internal Medicine

## 2020-01-10 DIAGNOSIS — B20 Human immunodeficiency virus [HIV] disease: Secondary | ICD-10-CM

## 2020-01-10 MED ORDER — GENVOYA 150-150-200-10 MG PO TABS: ORAL_TABLET | ORAL | 4 refills | Status: DC

## 2020-01-10 MED ORDER — DARUNAVIR ETHANOLATE 800 MG PO TABS: ORAL_TABLET | ORAL | 4 refills | Status: DC

## 2020-06-15 ENCOUNTER — Telehealth: Payer: Self-pay

## 2020-06-15 ENCOUNTER — Other Ambulatory Visit: Payer: Self-pay

## 2020-06-15 NOTE — Telephone Encounter (Signed)
I have attempted to reach out to the patient to get updated insurance information for the pharmacy. Patient's insurance on file is not going through for patient's medication. Patient did not answer. [name]

## 2020-06-15 NOTE — Telephone Encounter (Signed)
[facility] called, stating Prezista will need prior authorization. Clinic already working on this. [facility] would like update/call back when we get any information. Pharmacy call back [phone], Prescription [phone], Store [phone]. Andree Coss, RN

## 2020-06-16 ENCOUNTER — Ambulatory Visit (INDEPENDENT_AMBULATORY_CARE_PROVIDER_SITE_OTHER): Payer: Medicare Other | Admitting: Internal Medicine

## 2020-06-16 ENCOUNTER — Telehealth: Payer: Self-pay

## 2020-06-16 ENCOUNTER — Other Ambulatory Visit: Payer: Self-pay

## 2020-06-16 ENCOUNTER — Encounter: Payer: Self-pay | Admitting: Internal Medicine

## 2020-06-16 VITALS — BP 128/81 | HR 68 | Temp 98.6°F | Ht 71.0 in | Wt 176.0 lb

## 2020-06-16 DIAGNOSIS — B20 Human immunodeficiency virus [HIV] disease: Secondary | ICD-10-CM | POA: Diagnosis not present

## 2020-06-16 DIAGNOSIS — Z23 Encounter for immunization: Secondary | ICD-10-CM | POA: Diagnosis not present

## 2020-06-16 MED ORDER — DARUNAVIR ETHANOLATE 800 MG PO TABS: ORAL_TABLET | ORAL | 11 refills | Status: DC

## 2020-06-16 MED ORDER — GENVOYA 150-150-200-10 MG PO TABS: ORAL_TABLET | ORAL | 11 refills | Status: DC

## 2020-06-16 NOTE — Telephone Encounter (Signed)
[name] ,  I found coverage for Mr.[name] and his Co-Pay is $9.85.      Thank You,  Clearance Coots, CPhT Specialty Pharmacy Patient [facility] for Infectious Disease Phone: [phone] Fax:  [phone]

## 2020-06-16 NOTE — Progress Notes (Signed)
Patient Active Problem List   Diagnosis Date Noted  . Human immunodeficiency virus (HIV) disease ([place]) 07/03/2006    Priority: High  . Chronic hepatitis C virus infection ([place]) 07/03/2006    Priority: High  . Macrocytic anemia 05/15/2013    Priority: Medium  . Hyperglycemia 05/15/2013    Priority: Medium  . CIGARETTE SMOKER 07/03/2006    Priority: Medium  . Knee pain 12/11/2019  . Cirrhosis ([place]) 05/27/2019  . 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 Poor dentition 10/27/2015  . Avascular necrosis of hip ([place]) 07/20/2012  . Chronic bronchitis ([place]) 06/18/2012  . HIP FRACTURE, LEFT 06/15/2010  . ATYPICAL MYCOBACTERIAL INFECTION 12/04/2008  . TENDINITIS, THUMB 05/20/2008  . Condyloma acuminatum 07/03/2006  . ERECTILE DYSFUNCTION 07/03/2006  . Polysubstance abuse ([place]) 07/03/2006  . HX, PERSONAL, TUBERCULOSIS 07/03/2006  . HEPATITIS B, HX OF 07/03/2006    Patient's Medications  New Prescriptions   No medications on file  Previous Medications   METOPROLOL TARTRATE (LOPRESSOR) 50 MG TABLET    Take 1 tablet (50 mg total) by mouth 2 (two) times daily.   ONDANSETRON (ZOFRAN-ODT) 4 MG DISINTEGRATING TABLET    DISSOLVE ONE TABLET BY MOUTH EVERY 8 HOURS AS NEEDED FOR NAUSEA AND VOMITING  Modified Medications   Modified Medication Previous Medication   DARUNAVIR (PREZISTA) 800 MG TABLET darunavir (PREZISTA) 800 MG tablet      TAKE 1 TABLET BY MOUTH EVERY DAY WITH BREAKFAST    TAKE 1 TABLET BY MOUTH EVERY DAY WITH BREAKFAST   ELVITEGRAVIR-COBICISTAT-EMTRICITABINE-TENOFOVIR (GENVOYA) 150-150-200-10 MG TABS TABLET elvitegravir-cobicistat-emtricitabine-tenofovir (GENVOYA) 150-150-200-10 MG TABS tablet      TAKE 1 TABLET BY MOUTH EVERY DAY WITH BREAKFAST    TAKE 1 TABLET BY MOUTH EVERY DAY WITH BREAKFAST  Discontinued Medications   No medications on file    Subjective: [name] is in for his routine HIV follow-up visit.  The last 2 months he has had some problems getting his  Genvoya from [employer].  This is caused him to miss 5 or 6 doses of his medication.  He knows not to take his Prezista when he does not have his Genvoya.  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  He enjoys his work.  He is not feeling anxious or depressed.  He wants to get a COVID-vaccine.  Review of Systems: Review of Systems  Constitutional: Negative for fever, malaise/fatigue and weight loss.  Psychiatric/Behavioral: Negative for depression. The patient is not nervous/anxious.     Past Medical History:  Diagnosis Date  . Arthritis    left hip  . Bronchitis, chronic ([place])    per office note 06/18/12 Dr  Megan Salon  . Condyloma acuminatum   . Hepatitis    HX   B and C  . HIV disease ([place])   . Pneumonia    x 2  . Substance abuse ([place])    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,  ALCOHOL, CIGARETTE ABUSE  . Tuberculosis    per patient    Social History   Tobacco Use  . Smoking status: Current Some Day Smoker    Packs/day: 0.20    Years: 50.00    Pack years: 10.00    Types: Cigarettes  . Smokeless tobacco: Never Used  Substance Use Topics  . Alcohol use: Yes    Alcohol/week: 42.0 standard drinks    Types: 42 Standard drinks or equivalent per week    Comment: 6-12oz beers  .  Drug use: Yes    Frequency: 3.0 times per week    Types: "Crack" cocaine    Comment: last smoked crack 3 days ago - 10/27/15 pt stated that it was a month ago he last used    No family history on file.  No Known Allergies  Health Maintenance  Topic Date Due  . COVID-19 Vaccine (1) Never done  . TETANUS/TDAP  Never done  . COLONOSCOPY (Pts 45-62yr Insurance coverage will need to be confirmed)  Never done  . INFLUENZA VACCINE  01/05/2020  . Hepatitis C Screening  Completed  . PNA vac Low Risk Adult  Completed    Objective:  Vitals:   06/16/20 1548  BP: 128/81  Pulse: 68  Temp: 98.6 F (37 C)  TempSrc: Oral  SpO2: 97%  Weight: 176 lb (79.8 kg)  Height: 5' 11"  (1.803 m)    Body mass index is 24.55 kg/m.  Physical Exam Constitutional:      Comments: He is very talkative as usual.  Cardiovascular:     Rate and Rhythm: Normal rate and regular rhythm.     Heart sounds: No murmur heard.   Pulmonary:     Effort: Pulmonary effort is normal.     Breath sounds: Normal breath sounds.  Psychiatric:        Mood and Affect: Mood normal.     Lab Results Lab Results  Component Value Date   WBC 8.8 12/11/2019   HGB 15.6 12/11/2019   HCT 50.4 (H) 12/11/2019   MCV 90.6 12/11/2019   PLT 282 12/11/2019    Lab Results  Component Value Date   CREATININE 1.07 12/11/2019   BUN 19 12/11/2019   NA 140 12/11/2019   K 4.8 12/11/2019   CL 107 12/11/2019   CO2 26 12/11/2019    Lab Results  Component Value Date   ALT 21 12/11/2019   AST 20 12/11/2019   GGT 84 (H) 03/09/2017   ALKPHOS 111 10/08/2015   BILITOT 0.4 12/11/2019    Lab Results  Component Value Date   CHOL 272 (H) 09/04/2017   HDL 52 09/04/2017   LDLCALC 176 (H) 09/04/2017   TRIG 235 (H) 09/04/2017   CHOLHDL 5.2 (H) 09/04/2017   Lab Results  Component Value Date   LABRPR NON-REACTIVE 12/11/2019   HIV 1 RNA Quant (copies/mL)  Date Value  12/11/2019 41 (H)  05/14/2019 44 (H)  08/14/2018 <20 DETECTED (A)   CD4 T Cell Abs (/uL)  Date Value  12/11/2019 571  05/14/2019 723  08/14/2018 550     Problem List Items Addressed This Visit      High   Human immunodeficiency virus (HIV) disease [place]    His infection has been under very good, long-term control.  He met with our pharmacy staff today to help figure out why he has not been receiving Genvoya on a consistent basis.  I suspect it may be related to needing refills which have been sent in now.  He will get blood work here today and follow-up in 6 months.  We will help him find a place where he can start CL[place]ogavaccination.      Relevant Medications   darunavir (PREZISTA) 800 MG tablet    elvitegravir-cobicistat-emtricitabine-tenofovir (GENVOYA) 150-150-200-10 MG TABS tablet   Other Relevant Orders   T-helper cell (CD4)- ([facility] clinic only)   HIV-1 RNA quant-no reflex-bld   CBC   Comprehensive metabolic panel   RPR   Lipid panel  Michel Bickers, MD [facility] [place] [facility] [phone] pager   [phone] cell 06/16/2020, 4:13 PM

## 2020-06-16 NOTE — Telephone Encounter (Signed)
Relayed the following information to [employer] per Lupita Leash. [facility] will try to process this, will reach out to [facility] for follow up if they have questions or issues. BIN [id number], PCN [id number], ID [id number] , RX GRP COS Andree Coss, RN

## 2020-06-16 NOTE — Telephone Encounter (Signed)
[facility] Patient Product/process development scientist completed.    The patient is insured through [place] and has a $9.85 copay.  We will continue to follow to see if copay assistance is needed.  Clearance Coots, CPhT Specialty Pharmacy Patient [facility] for Infectious Disease Phone: [phone] Fax:  [phone]

## 2020-06-16 NOTE — Assessment & Plan Note (Signed)
His infection has been under very good, long-term control.  He met with our pharmacy staff today to help figure out why he has not been receiving Genvoya on a consistent basis.  I suspect it may be related to needing refills which have been sent in now.  He will get blood work here today and follow-up in 6 months.  We will help him find a place where he can start [place] vaccination.

## 2020-06-17 LAB — T-HELPER CELL (CD4) - (RCID CLINIC ONLY)
CD4 % Helper T Cell: 18 % — ABNORMAL LOW (ref 33–65)
CD4 T Cell Abs: 442 /uL (ref 400–1790)

## 2020-06-22 LAB — HIV-1 RNA QUANT-NO REFLEX-BLD
HIV 1 RNA Quant: <20 Copies/mL
HIV-1 RNA Quant, Log: <1.3 Log cps/mL

## 2020-06-22 LAB — COMPREHENSIVE METABOLIC PANEL
AG Ratio: 1.4 (calc) (ref 1.0–2.5)
ALT: 30 U/L (ref 9–46)
AST: 31 U/L (ref 10–35)
Albumin: 4.2 g/dL (ref 3.6–5.1)
Alkaline phosphatase (APISO): 82 U/L (ref 35–144)
BUN: 18 mg/dL (ref 7–25)
CO2: 27 mmol/L (ref 20–32)
Calcium: 9.7 mg/dL (ref 8.6–10.3)
Chloride: 108 mmol/L (ref 98–110)
Creat: 1.05 mg/dL (ref 0.70–1.25)
Globulin: 3 g/dL (calc) (ref 1.9–3.7)
Glucose, Bld: 130 mg/dL — ABNORMAL HIGH (ref 65–99)
Potassium: 5.6 mmol/L — ABNORMAL HIGH (ref 3.5–5.3)
Sodium: 141 mmol/L (ref 135–146)
Total Bilirubin: 0.4 mg/dL (ref 0.2–1.2)
Total Protein: 7.2 g/dL (ref 6.1–8.1)

## 2020-06-22 LAB — CBC
HCT: 45.8 % (ref 38.5–50.0)
Hemoglobin: 15.4 g/dL (ref 13.2–17.1)
MCH: 29.8 pg (ref 27.0–33.0)
MCHC: 33.6 g/dL (ref 32.0–36.0)
MCV: 88.6 fL (ref 80.0–100.0)
MPV: 10.2 fL (ref 7.5–12.5)
Platelets: 277 10*3/uL (ref 140–400)
RBC: 5.17 10*6/uL (ref 4.20–5.80)
RDW: 12.3 % (ref 11.0–15.0)
WBC: 8.1 10*3/uL (ref 3.8–10.8)

## 2020-06-22 LAB — LIPID PANEL
Cholesterol: 202 mg/dL — ABNORMAL HIGH (ref <200)
HDL: 72 mg/dL (ref >=40)
LDL Cholesterol (Calc): 101 mg/dL (calc) — ABNORMAL HIGH
Non-HDL Cholesterol (Calc): 130 mg/dL (calc) — ABNORMAL HIGH (ref <130)
Total CHOL/HDL Ratio: 2.8 (calc) (ref <5.0)
Triglycerides: 173 mg/dL — ABNORMAL HIGH (ref <150)

## 2020-06-22 LAB — RPR: RPR Ser Ql: NONREACTIVE

## 2020-07-13 ENCOUNTER — Other Ambulatory Visit: Payer: Self-pay | Admitting: Internal Medicine

## 2020-07-13 DIAGNOSIS — B20 Human immunodeficiency virus [HIV] disease: Secondary | ICD-10-CM

## 2020-09-01 ENCOUNTER — Other Ambulatory Visit: Payer: Self-pay

## 2020-09-01 ENCOUNTER — Emergency Department (HOSPITAL_COMMUNITY): Payer: Medicare Other

## 2020-09-01 ENCOUNTER — Encounter (HOSPITAL_COMMUNITY): Payer: Self-pay

## 2020-09-01 ENCOUNTER — Telehealth (HOSPITAL_COMMUNITY): Payer: Self-pay | Admitting: Emergency Medicine

## 2020-09-01 ENCOUNTER — Ambulatory Visit (HOSPITAL_COMMUNITY)
Admission: EM | Admit: 2020-09-01 | Discharge: 2020-09-01 | Disposition: A | Discharge status: Other Acute Inpatient Hospital | Payer: Medicare Other

## 2020-09-01 ENCOUNTER — Emergency Department (HOSPITAL_COMMUNITY)
Admission: EM | Admit: 2020-09-01 | Discharge: 2020-09-01 | Disposition: A | Discharge status: Home / Self Care | Payer: Medicare Other | Attending: Emergency Medicine | Admitting: Emergency Medicine

## 2020-09-01 DIAGNOSIS — Z96642 Presence of left artificial hip joint: Secondary | ICD-10-CM | POA: Insufficient documentation

## 2020-09-01 DIAGNOSIS — S299XXA Unspecified injury of thorax, initial encounter: Secondary | ICD-10-CM | POA: Diagnosis present

## 2020-09-01 DIAGNOSIS — W01198A Fall on same level from slipping, tripping and stumbling with subsequent striking against other object, initial encounter: Secondary | ICD-10-CM | POA: Diagnosis not present

## 2020-09-01 DIAGNOSIS — S060X9A Concussion with loss of consciousness of unspecified duration, initial encounter: Secondary | ICD-10-CM | POA: Diagnosis not present

## 2020-09-01 DIAGNOSIS — Z21 Asymptomatic human immunodeficiency virus [HIV] infection status: Secondary | ICD-10-CM | POA: Insufficient documentation

## 2020-09-01 DIAGNOSIS — S62327A Displaced fracture of shaft of fifth metacarpal bone, left hand, initial encounter for closed fracture: Secondary | ICD-10-CM | POA: Diagnosis not present

## 2020-09-01 DIAGNOSIS — S3991XA Unspecified injury of abdomen, initial encounter: Secondary | ICD-10-CM | POA: Insufficient documentation

## 2020-09-01 DIAGNOSIS — S2242XA Multiple fractures of ribs, left side, initial encounter for closed fracture: Secondary | ICD-10-CM | POA: Diagnosis not present

## 2020-09-01 DIAGNOSIS — F17210 Nicotine dependence, cigarettes, uncomplicated: Secondary | ICD-10-CM | POA: Insufficient documentation

## 2020-09-01 DIAGNOSIS — T1490XA Injury, unspecified, initial encounter: Secondary | ICD-10-CM

## 2020-09-01 DIAGNOSIS — S069X9A Unspecified intracranial injury with loss of consciousness of unspecified duration, initial encounter: Secondary | ICD-10-CM

## 2020-09-01 LAB — I-STAT CHEM 8, ED
BUN: 12 mg/dL (ref 8–23)
Calcium, Ion: 1.26 mmol/L (ref 1.15–1.40)
Chloride: 109 mmol/L (ref 98–111)
Creatinine, Ser: 0.9 mg/dL (ref 0.61–1.24)
Glucose, Bld: 102 mg/dL — ABNORMAL HIGH (ref 70–99)
HCT: 42 % (ref 39.0–52.0)
Hemoglobin: 14.3 g/dL (ref 13.0–17.0)
Potassium: 4.7 mmol/L (ref 3.5–5.1)
Sodium: 142 mmol/L (ref 135–145)
TCO2: 26 mmol/L (ref 22–32)

## 2020-09-01 LAB — CBC WITH DIFFERENTIAL/PLATELET
Abs Immature Granulocytes: 0.02 10*3/uL (ref 0.00–0.07)
Basophils Absolute: 0.1 10*3/uL (ref 0.0–0.1)
Basophils Relative: 1 %
Eosinophils Absolute: 0.2 10*3/uL (ref 0.0–0.5)
Eosinophils Relative: 2 %
HCT: 42.9 % (ref 39.0–52.0)
Hemoglobin: 13.5 g/dL (ref 13.0–17.0)
Immature Granulocytes: 0 %
Lymphocytes Relative: 41 %
Lymphs Abs: 4 10*3/uL (ref 0.7–4.0)
MCH: 29.2 pg (ref 26.0–34.0)
MCHC: 31.5 g/dL (ref 30.0–36.0)
MCV: 92.7 fL (ref 80.0–100.0)
Monocytes Absolute: 1 10*3/uL (ref 0.1–1.0)
Monocytes Relative: 10 %
Neutro Abs: 4.4 10*3/uL (ref 1.7–7.7)
Neutrophils Relative %: 46 %
Platelets: 257 10*3/uL (ref 150–400)
RBC: 4.63 MIL/uL (ref 4.22–5.81)
RDW: 13.9 % (ref 11.5–15.5)
WBC: 9.6 10*3/uL (ref 4.0–10.5)
nRBC: 0 % (ref 0.0–0.2)

## 2020-09-01 LAB — COMPREHENSIVE METABOLIC PANEL
ALT: 21 U/L (ref 0–44)
AST: 29 U/L (ref 15–41)
Albumin: 3.9 g/dL (ref 3.5–5.0)
Alkaline Phosphatase: 49 U/L (ref 38–126)
Anion gap: 6 (ref 5–15)
BUN: 10 mg/dL (ref 8–23)
CO2: 23 mmol/L (ref 22–32)
Calcium: 9.2 mg/dL (ref 8.9–10.3)
Chloride: 110 mmol/L (ref 98–111)
Creatinine, Ser: 0.9 mg/dL (ref 0.61–1.24)
GFR, Estimated: >60 mL/min (ref >60)
Glucose, Bld: 104 mg/dL — ABNORMAL HIGH (ref 70–99)
Potassium: 4.7 mmol/L (ref 3.5–5.1)
Sodium: 139 mmol/L (ref 135–145)
Total Bilirubin: 0.7 mg/dL (ref 0.3–1.2)
Total Protein: 6.8 g/dL (ref 6.5–8.1)

## 2020-09-01 MED ORDER — HYDROCODONE-ACETAMINOPHEN 5-325 MG PO TABS
1.0000 | ORAL_TABLET | Freq: Once | ORAL | Status: AC
  Administered 2020-09-01: 1 via ORAL
  Filled 2020-09-01: qty 1

## 2020-09-01 MED ORDER — OXYCODONE-ACETAMINOPHEN 5-325 MG PO TABS: 1.0000 | ORAL_TABLET | Freq: Four times a day (QID) | ORAL | 0 refills | Status: DC | PRN

## 2020-09-01 MED ORDER — IOHEXOL 300 MG/ML  SOLN
100.0000 mL | Freq: Once | INTRAMUSCULAR | Status: AC | PRN
  Administered 2020-09-01: 100 mL via INTRAVENOUS
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
## 2020-09-01 NOTE — ED Triage Notes (Signed)
Patient had mechanical fall yesterday landing on left side and striking head, reports positive loc. States he slipped while closing garage door. Complains of left rib pain and swelling and pain to left hand. Denies any other complaint or injury

## 2020-09-01 NOTE — Telephone Encounter (Signed)
This nurse called to lobby for patient checking in at patient access.  Patient states he fell yesterday.  Was running to vehicle, slipped and fell forward.  Left hand swollen, touches left ribs as a source of pain and has a laceration to left eye brow. Patient states he had loc when he fell.  Currently alert and oriented x 3.  Has a family member with him .  Spoke to stephanie, np about patient.  Patient to go to ED for potential head CT.  Patient and family member agreeable to recommendation

## 2020-09-01 NOTE — ED Notes (Signed)
[name]  [phone] have patient called when he is discharged
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
## 2020-09-01 NOTE — Discharge Instructions (Signed)
Please read instructions below. Keep the splint clean, dry and in place at all times. Elevate your hand as much as possible. You can take percocet every 6 hours as needed for pain. The hand doctor's office will call you tomorrow to schedule your follow-up appointment. Use the breathing tool multiple times daily to help your lungs stay healthy while you heal. Return to the ED for shortness of breath or severely worsening rib pain.

## 2020-09-10 ENCOUNTER — Telehealth: Payer: Self-pay

## 2020-09-10 NOTE — Telephone Encounter (Signed)
Received call from patient, states he was recently seen in the emergency department for a fall and was instructed to follow up with Dr. [name]. Patient scheduled for next available appointment on 09/22/20.   Patient states he is unable to get his medications from the pharmacy. RN called [facility], they have both Prezista and Genvoya ready to be picked up. Darrin Luis will be a $0 copay and Prezista $9.85. RN relayed this information to the patient. Patient verbalized understanding and has no further questions.   Sandie Ano, RN

## 2020-09-22 ENCOUNTER — Ambulatory Visit: Payer: Medicare Other | Admitting: Internal Medicine

## 2020-09-25 IMAGING — US ULTRASOUND ABDOMEN COMPLETE
1 series · 14 of 25 positions shown · non-contrast
Comparison: CT 08/08/2009

CLINICAL DATA: Hepatitis-C, chronic

EXAM:
ABDOMEN ULTRASOUND COMPLETE

[Series 1: ultrasound abdomen complete · 0.26mm/px · 14 of 77 slices shown]
[im 1/77]
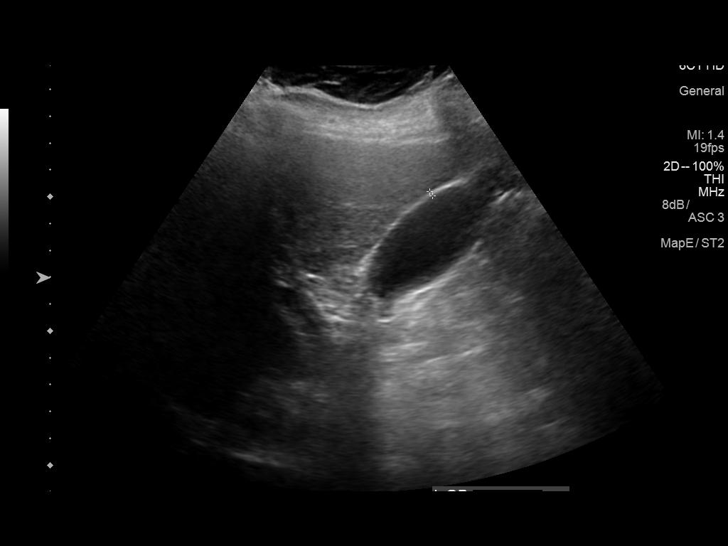
[im 7/77]
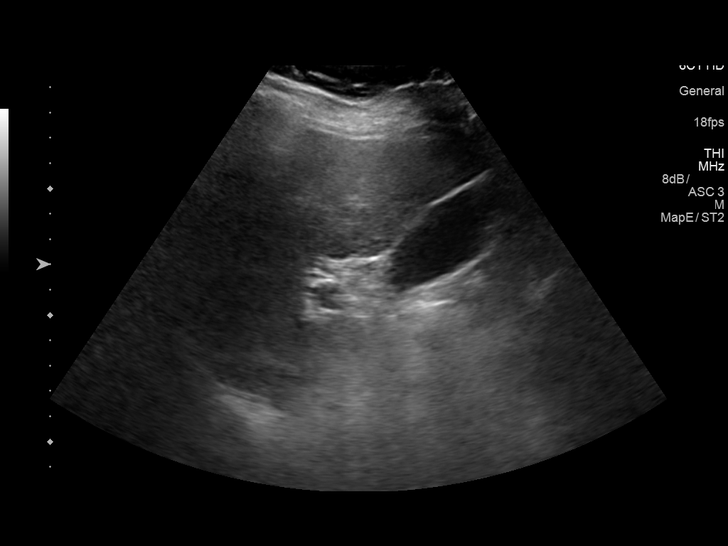
[im 13/77]
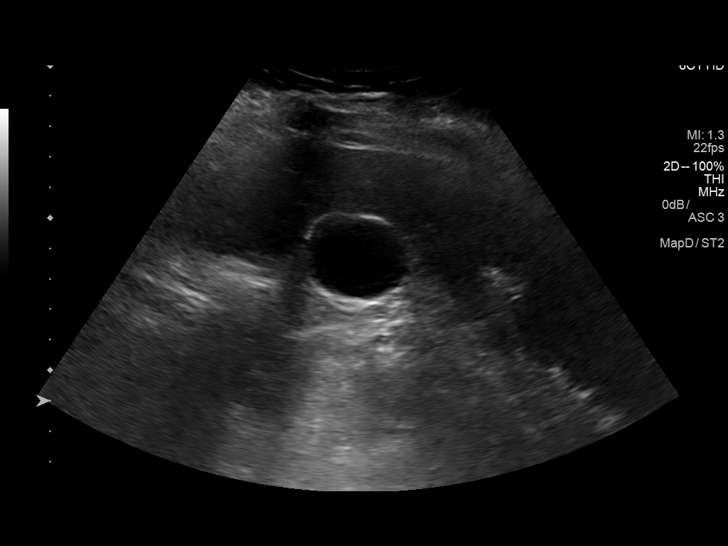
[im 20/77]
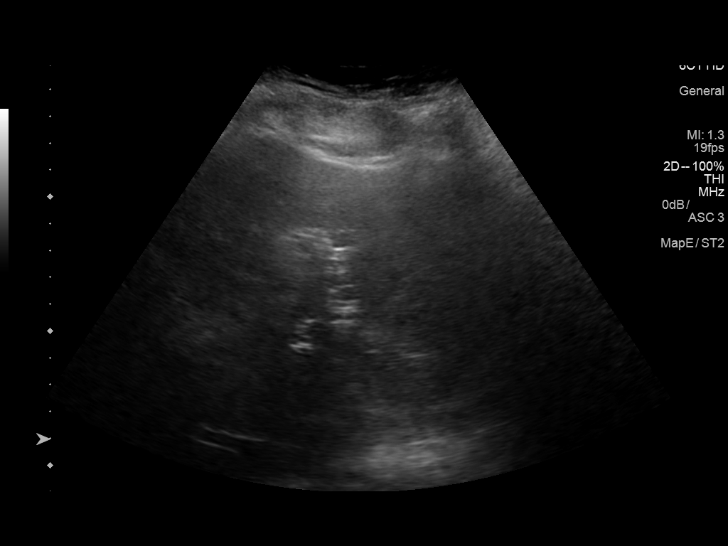
[im 26/77]
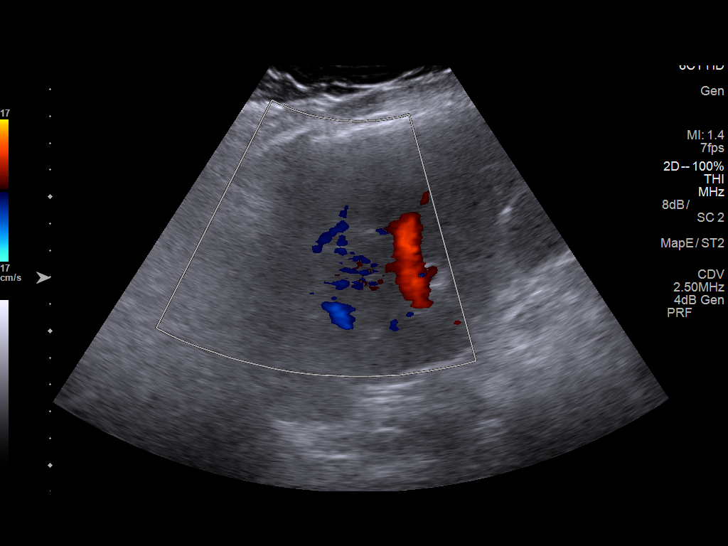
[im 29/77]
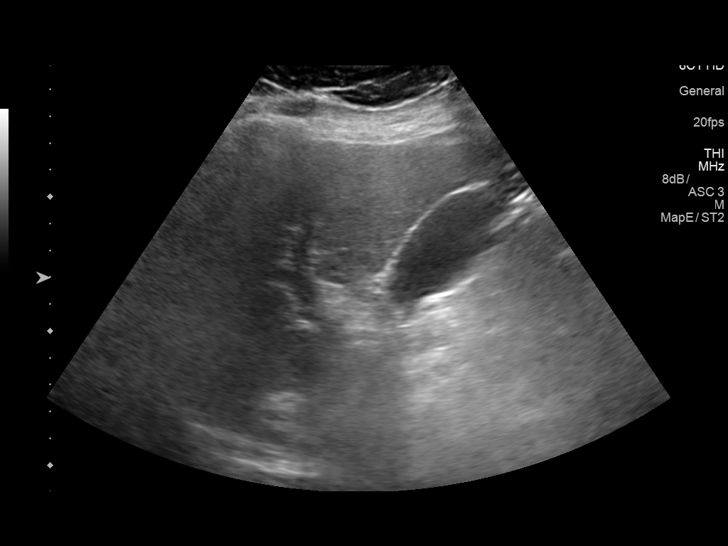
[im 35/77]
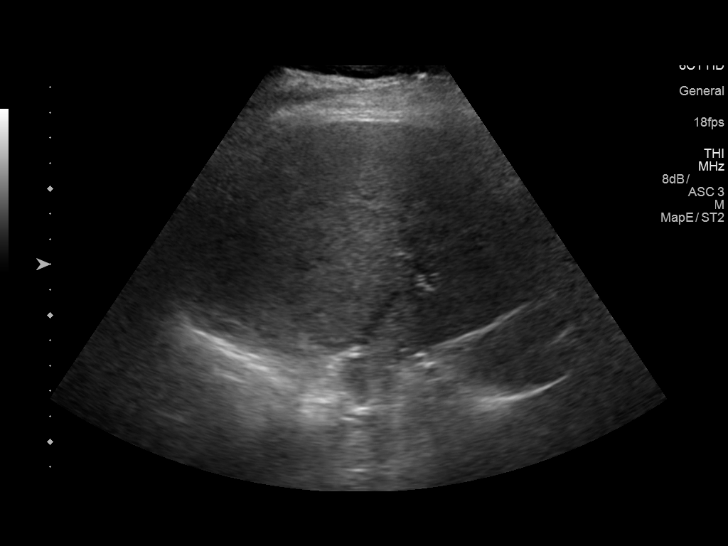
[im 42/77]
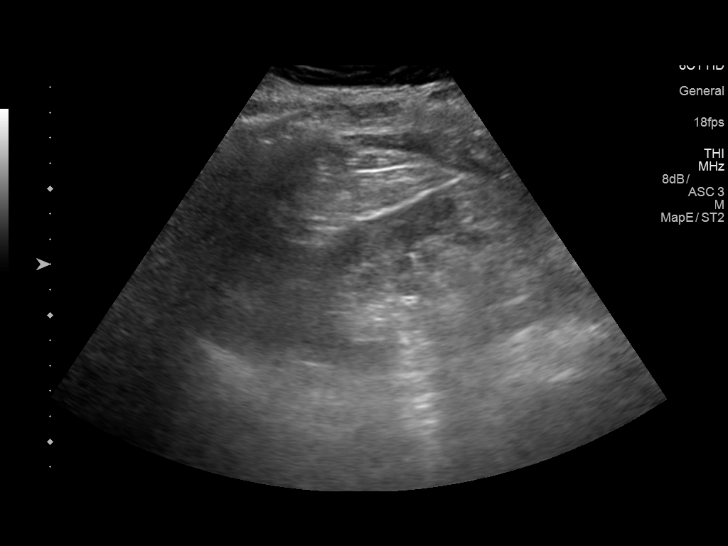
[im 48/77]
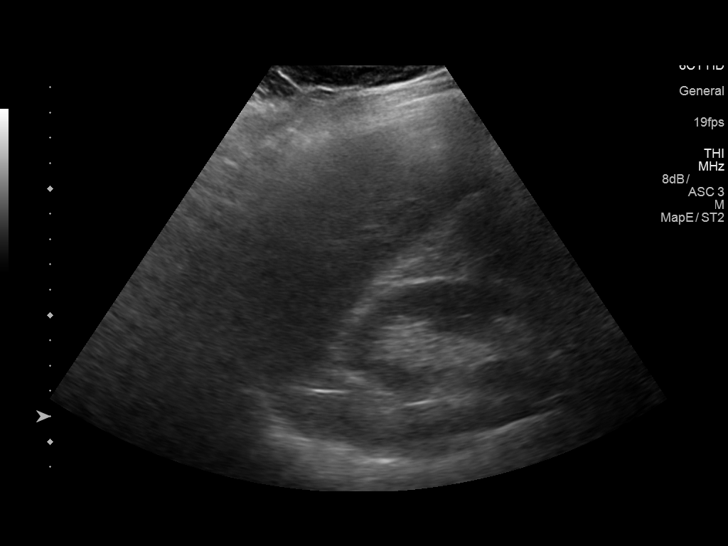
[im 51/77]
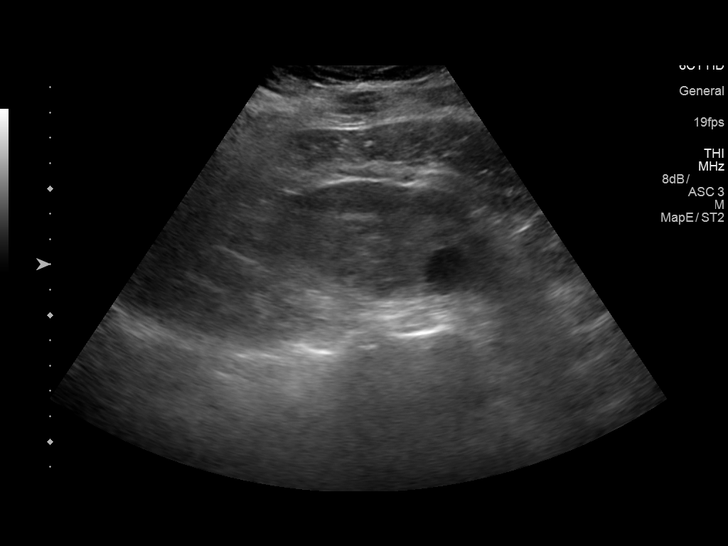
[im 58/77]
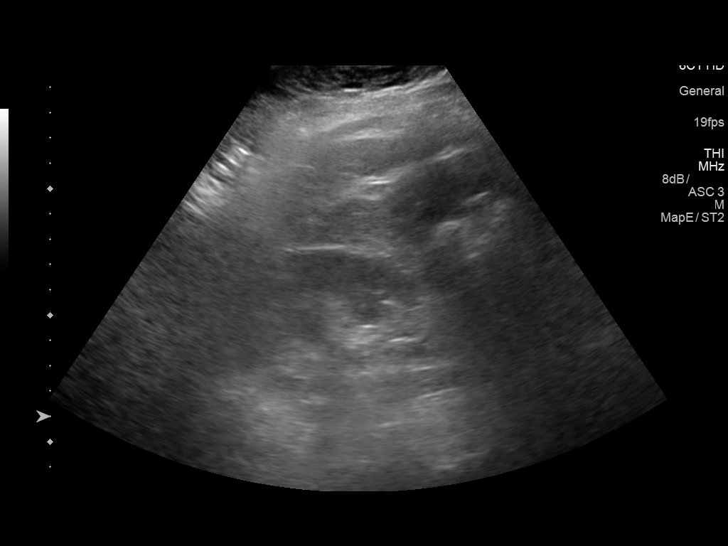
[im 64/77]
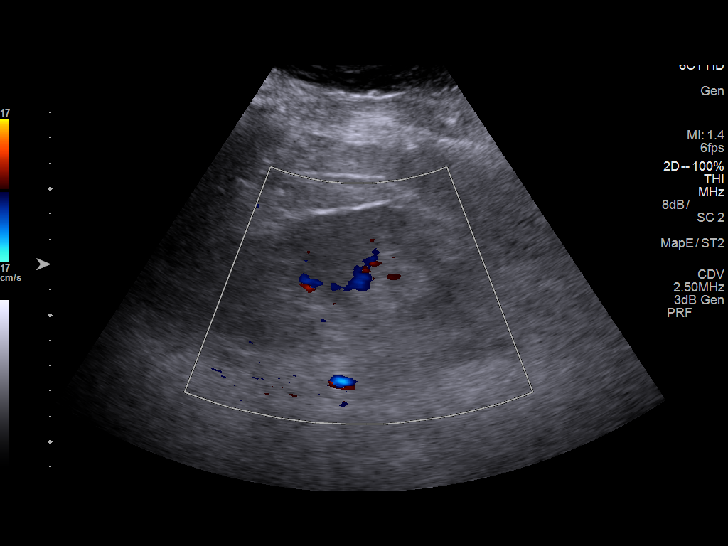
[im 70/77]
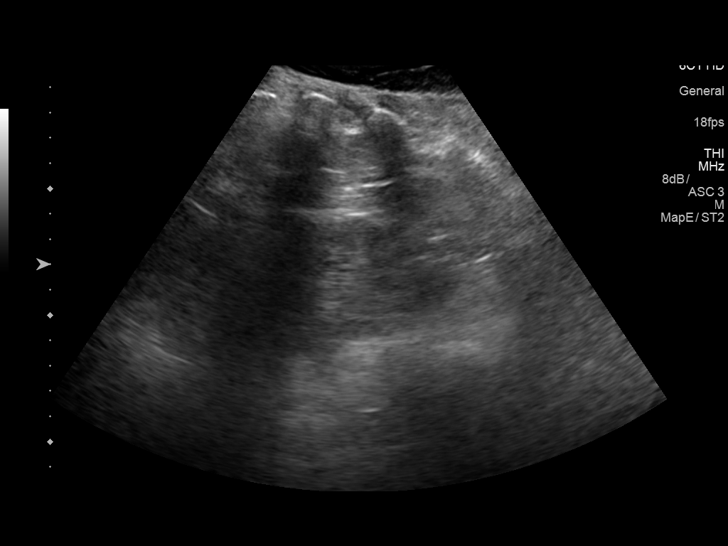
[im 77/77]
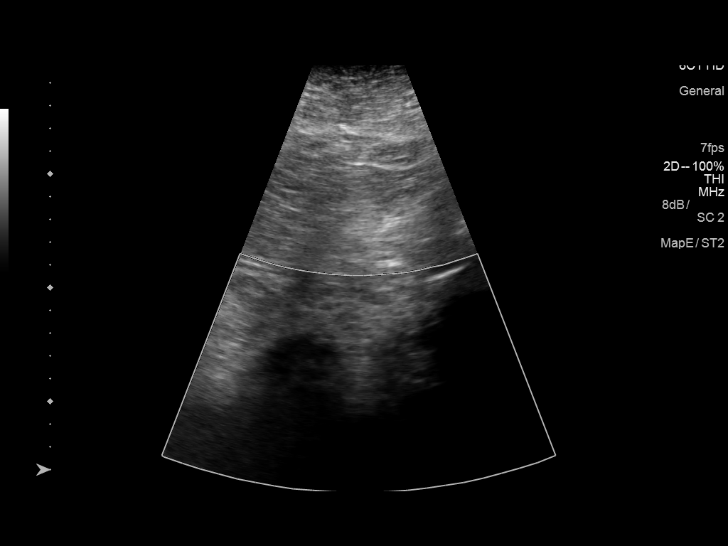

[14 of 25 positions shown; findings below may reference images not displayed]

FINDINGS: Gallbladder: No gallstones or wall thickening visualized. No
sonographic Murphy sign noted by sonographer.

Common bile duct: Diameter: Normal 4 mm

Liver: There is liver has a coarsened echotexture and lobular
contour. No focal lesion. No duct dilatation portal vein is patent
on color Doppler imaging with normal direction of blood flow towards
the liver. Exam is limited.

IVC: No abnormality visualized.

Pancreas: Not visualize

Spleen: Post splenectomy

Right Kidney: Length: 11.7 cm.  1.7 cm anechoic cyst.

Left Kidney: Length: 10.7 cm. Echogenicity within normal limits. No
mass or hydronephrosis visualized.

Abdominal aorta: No aneurysm visualized.

Other findings: None.
IMPRESSION: 1. Echogenic nodule liver. Findings suggest early cirrhosis. Exam
is limited.
2. No focal hepatic lesion.
3. Benign appearing cyst of the RIGHT kidney.

## 2020-12-16 ENCOUNTER — Ambulatory Visit: Payer: Medicare Other | Admitting: Internal Medicine

## 2021-01-18 ENCOUNTER — Telehealth: Payer: Self-pay

## 2021-01-18 NOTE — Telephone Encounter (Signed)
Patient called, says he has been unable to get his Genvoya and Prezista from the pharmacy for about 2 months. RN called [facility], they have both medications ready to be picked up today, patient made aware. Advised him to call [facility] to set up home delivery.   Sandie Ano, RN

## 2021-01-27 ENCOUNTER — Ambulatory Visit: Payer: Medicare Other | Admitting: Internal Medicine

## 2021-02-02 ENCOUNTER — Telehealth: Payer: Self-pay

## 2021-02-02 ENCOUNTER — Ambulatory Visit: Payer: Medicare Other | Admitting: Internal Medicine

## 2021-02-02 NOTE — Telephone Encounter (Signed)
Called patient to see if he would be able to make it to today's appointment. His sister answered and asked if he forgot his appointment. Asked that she have him reach out to his doctor's office to reschedule when he can. Did not discuss any private or health information.   Sandie Ano, RN

## 2021-07-16 ENCOUNTER — Other Ambulatory Visit: Payer: Self-pay | Admitting: Internal Medicine

## 2021-07-16 DIAGNOSIS — B20 Human immunodeficiency virus [HIV] disease: Secondary | ICD-10-CM

## 2021-07-16 NOTE — Telephone Encounter (Signed)
Left voice mail asking patient to return my call.

## 2021-07-16 NOTE — Telephone Encounter (Signed)
Left vm asking patient to call [facility] Triage. Will need follow up scheduled.

## 2021-07-20 ENCOUNTER — Encounter: Payer: Self-pay | Admitting: Internal Medicine

## 2021-07-20 ENCOUNTER — Telehealth: Payer: Self-pay

## 2021-07-20 ENCOUNTER — Ambulatory Visit (INDEPENDENT_AMBULATORY_CARE_PROVIDER_SITE_OTHER): Payer: Medicare Other | Admitting: Pharmacist

## 2021-07-20 ENCOUNTER — Other Ambulatory Visit: Payer: Self-pay

## 2021-07-20 ENCOUNTER — Other Ambulatory Visit (HOSPITAL_COMMUNITY): Payer: Self-pay

## 2021-07-20 ENCOUNTER — Ambulatory Visit: Payer: Medicare Other

## 2021-07-20 DIAGNOSIS — B20 Human immunodeficiency virus [HIV] disease: Secondary | ICD-10-CM

## 2021-07-20 MED ORDER — SYMTUZA 800-150-200-10 MG PO TABS: 1.0000 | ORAL_TABLET | Freq: Every day | ORAL | 2 refills | Status: DC

## 2021-07-20 NOTE — Progress Notes (Signed)
HPI: [name] is a 71 y.o. male who presents to the [facility] pharmacy clinic for HIV follow-up.  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   Poor dentition 10/27/2015   Macrocytic anemia 05/15/2013   Hyperglycemia 05/15/2013   Avascular necrosis of hip (HCC) 07/20/2012   Chronic bronchitis (HCC) 06/18/2012   HIP FRACTURE, LEFT 06/15/2010   ATYPICAL MYCOBACTERIAL INFECTION 12/04/2008   TENDINITIS, THUMB 05/20/2008   Human immunodeficiency virus (HIV) disease (HCC) 07/03/2006   Chronic hepatitis C virus infection (HCC) 07/03/2006   Condyloma acuminatum 07/03/2006   ERECTILE DYSFUNCTION 07/03/2006   CIGARETTE SMOKER 07/03/2006   Polysubstance abuse (HCC) 07/03/2006   HX, PERSONAL, TUBERCULOSIS 07/03/2006   HEPATITIS B, HX OF 07/03/2006    Patient's Medications  New Prescriptions   DARUNAVIR-COBICISTAT-EMTRICITABINE-TENOFOVIR ALAFENAMIDE (SYMTUZA) 800-150-200-10 MG TABS    Take 1 tablet by mouth daily with breakfast.  Previous Medications   METOPROLOL TARTRATE (LOPRESSOR) 50 MG TABLET    Take 1 tablet (50 mg total) by mouth 2 (two) times daily.   ONDANSETRON (ZOFRAN-ODT) 4 MG DISINTEGRATING TABLET    DISSOLVE ONE TABLET BY MOUTH EVERY 8 HOURS AS NEEDED FOR NAUSEA AND VOMITING   OXYCODONE-ACETAMINOPHEN (PERCOCET/ROXICET) 5-325 MG TABLET    Take 1-2 tablets by mouth every 6 (six) hours as needed for severe pain.  Modified Medications   No medications on file  Discontinued Medications   DARUNAVIR (PREZISTA) 800 MG TABLET    TAKE 1 TABLET BY MOUTH EVERY DAY WITH BREAKFAST   ELVITEGRAVIR-COBICISTAT-EMTRICITABINE-TENOFOVIR (GENVOYA) 150-150-200-10 MG TABS TABLET    TAKE 1 TABLET BY MOUTH EVERY DAY WITH BREAKFAST    Allergies: No Known Allergies  Past Medical History: Past Medical History:  Diagnosis Date   Arthritis    left hip   Bronchitis, chronic (HCC)    per office note  06/18/12 Dr  [name]   Condyloma acuminatum    Hepatitis    HX   B and C   HIV disease (HCC)    Pneumonia    x 2   Substance abuse (HCC)    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,  ALCOHOL, CIGARETTE ABUSE   Tuberculosis    per patient    Social History: Social History   Socioeconomic History   Marital status: Married    Spouse name: Not on file   Number of children: Not on file   Years of education: Not on file   Highest education level: Not on file  Occupational History   Not on file  Tobacco Use   Smoking status: Some Days    Packs/day: 0.20    Years: 50.00    Pack years: 10.00    Types: Cigarettes   Smokeless tobacco: Never  Substance and Sexual Activity   Alcohol use: Yes    Alcohol/week: 42.0 standard drinks    Types: 42 Standard drinks or equivalent per week    Comment: 6-12oz beers   Drug use: Yes    Frequency: 3.0 times per week    Types: "Crack" cocaine    Comment: last smoked crack 3 days ago - 10/27/15 pt stated that it was a month ago he last used   Sexual activity: Not Currently    Comment: declined condoms  Other Topics Concern   Not on file  Social History Narrative   Not on file   Social Determinants of Health   Financial Resource Strain:  Not on file  Food Insecurity: Not on file  Transportation Needs: Not on file  Physical Activity: Not on file  Stress: Not on file  Social Connections: Not on file    Labs: Lab Results  Component Value Date   HIV1RNAQUANT <20 06/16/2020   HIV1RNAQUANT 41 (H) 12/11/2019   HIV1RNAQUANT 44 (H) 05/14/2019   CD4TABS 442 06/16/2020   CD4TABS 571 12/11/2019   CD4TABS 723 05/14/2019    RPR and STI Lab Results  Component Value Date   LABRPR NON-REACTIVE 06/16/2020   LABRPR NON-REACTIVE 12/11/2019   LABRPR NON-REACTIVE 05/14/2019   LABRPR NON-REACTIVE 08/14/2018   LABRPR NON-REACTIVE 09/04/2017    No flowsheet data found.  Hepatitis B Lab Results  Component Value Date   HEPBSAB Yes 07/31/2006   Hepatitis  C Lab Results  Component Value Date   HCVRNAPCRQN <15 NOT DETECTED 01/02/2018   Hepatitis A No results found for: HAV Lipids: Lab Results  Component Value Date   CHOL 202 (H) 06/16/2020   TRIG 173 (H) 06/16/2020   HDL 72 06/16/2020   CHOLHDL 2.8 06/16/2020   VLDL 16 10/08/2015   LDLCALC 101 (H) 06/16/2020    Current HIV Regimen: Genvoya/Prezista   Assessment: Taje presents to clinic today after missed appointments with Dr. [name] for his HIV care. He was last seen in January 2022 and ran out of medicine in November. States this is due to moving multiple times within [place] while taking different jobs and also not having transportation. He states he has been adherent to his regimen otherwise and simply ran out of medication (I.e. he did not space out medicine over time). Will check HIV RNA w/ genotype, CD4, lipids, RPR, Bmet, CBC, and urine cytology today. Will also optimize his regimen to Symtuza. Although Symtuza drops the INSTI component, elvitegravir is not a very robust INSTI as a first-generation INSTI and is probably not offering significant advantage to his ART. Could not find history of resistance in prior genotypes, so patient may even be eligible for Biktarvy. Will defer further ART recommendations to Dr. [name]. Patient was also provided bus passes today to assist with transportation.   Patient has $10 copay for symtuza through Medicare; however, applied for SPAP today with financial to allow for $0 copay. Provided patient with 30-day supply of Symtuza and sent refills to [facility] on [address].   Plan: Check HIV RNA w/ genotype, CD4, lipids, RPR, Bmet, CBC, and urine cytology  Stop Genvoya/Prezista Start Symtuza Follow-up with Dr. [name] on 3/16 at 9:15am   Margarite Gouge, PharmD, CPP Clinical Pharmacist Practitioner Infectious Diseases Clinical Pharmacist [facility] 07/20/2021, 3:24 PM

## 2021-07-20 NOTE — Addendum Note (Signed)
Addended by: Theresia Majors A on: 07/20/2021 04:00 PM   Modules accepted: Orders

## 2021-07-20 NOTE — Telephone Encounter (Signed)
[facility] Patient Product/process development scientist completed.    The patient is insured through [employer] and has a $10.35 copay.  We will continue to follow to see if copay assistance is needed.  Clearance Coots, CPhT Specialty Pharmacy Patient [facility] Phone: [phone] Fax:  [phone]

## 2021-07-22 ENCOUNTER — Other Ambulatory Visit: Payer: Self-pay | Admitting: Pharmacist

## 2021-07-22 DIAGNOSIS — B20 Human immunodeficiency virus [HIV] disease: Secondary | ICD-10-CM

## 2021-07-22 MED ORDER — SYMTUZA 800-150-200-10 MG PO TABS: 1.0000 | ORAL_TABLET | Freq: Every day | ORAL | 0 refills | Status: AC

## 2021-07-22 NOTE — Progress Notes (Signed)
Medication Samples have been provided to the patient.  Drug name: Symtuza        Strength: 800/150/200/10 mg Qty: 30 tablets (1 bottle)  LOT: [id number]   Exp.Date: 11/05/22  Dosing instructions: Take one tablet by mouth once daily with food  The patient has been instructed regarding the correct time, dose, and frequency of taking this medication, including desired effects and most common side effects.   Clevland Cork L. Eber Hong, PharmD, BCIDP, AAHIVP, CPP Clinical Pharmacist Practitioner Infectious Diseases [facility] 05/18/2020, 10:07 AM

## 2021-07-30 ENCOUNTER — Emergency Department (HOSPITAL_COMMUNITY): Payer: Medicare Other

## 2021-07-30 ENCOUNTER — Encounter (HOSPITAL_COMMUNITY): Payer: Self-pay

## 2021-07-30 ENCOUNTER — Other Ambulatory Visit: Payer: Self-pay

## 2021-07-30 ENCOUNTER — Emergency Department (HOSPITAL_COMMUNITY)
Admission: EM | Admit: 2021-07-30 | Discharge: 2021-07-30 | Disposition: A | Discharge status: Home / Self Care | Payer: Medicare Other | Attending: Emergency Medicine | Admitting: Emergency Medicine

## 2021-07-30 DIAGNOSIS — M25552 Pain in left hip: Secondary | ICD-10-CM | POA: Diagnosis present

## 2021-07-30 MED ORDER — KETOROLAC TROMETHAMINE 15 MG/ML IJ SOLN
15.0000 mg | Freq: Once | INTRAMUSCULAR | Status: AC
  Administered 2021-07-30: 15 mg via INTRAVENOUS
  Filled 2021-07-30: qty 1

## 2021-07-30 NOTE — Discharge Instructions (Signed)
No evidence of fracture or dislocation was seen on your x-rays today Please use anti-inflammatories as needed for pain Use crutches as needed to walk Use cold therapy when resting Please recheck with your orthopedist in the next 1 to 2 weeks Return if you are having any new or worsening symptoms especially redness, fever, or worsening pain

## 2021-07-30 NOTE — Progress Notes (Signed)
Will await INSTI genotype results.

## 2021-07-30 NOTE — ED Triage Notes (Addendum)
Pt BIB [facility] from home. Patient c/o left groin and hip pain for 2 days.Patient was unable to ambulate this morning due to pain. Patient has hx HIV. Denies taking blood thinners. EMS gave 200 fentanyl.   Vital signs were:  150/70 70-HR 121-CBG 96% RA

## 2021-07-30 NOTE — ED Provider Notes (Signed)
[facility]-EMERGENCY DEPT Provider Note   CSN: [id number] Arrival date & time: 07/30/21  8144     History  No chief complaint on file.   [name] is a 71 y.o. male.  HPI 71 yo male complaining of left hip pain began 4 days ago- no trauma.  Patient states he is normally able to run and walk.  Patient reports he had a left hip surgery by Dr. Magnus Ivan.  He denies any injury.  He reports that his sister gave him oxycodone without relief.  He states the pain has made him sweaty but denies Pain radiates to left knee. He thinks his hip is dislocated.  He did not have a specific event that he can recall initiating the pain- states it was just there in the morning.      Home Medications Prior to Admission medications   Medication Sig Start Date End Date Taking? Authorizing Provider  Darunavir-Cobicistat-Emtricitabine-Tenofovir Alafenamide [facility]) 800-150-200-10 MG TABS Take 1 tablet by mouth daily with breakfast. 07/20/21   Jennette Kettle, RPH-CPP  Darunavir-Cobicistat-Emtricitabine-Tenofovir Alafenamide (SYMTUZA) 800-150-200-10 MG TABS Take 1 tablet by mouth daily with breakfast. 07/21/21 08/20/21  Kuppelweiser, Cassie L, RPH-CPP  metoprolol tartrate (LOPRESSOR) 50 MG tablet Take 1 tablet (50 mg total) by mouth 2 (two) times daily. 08/16/18   Cliffton Asters, MD  ondansetron (ZOFRAN-ODT) 4 MG disintegrating tablet DISSOLVE ONE TABLET BY MOUTH EVERY 8 HOURS AS NEEDED FOR NAUSEA AND VOMITING Patient not taking: Reported on 06/16/2020 08/04/17   Cliffton Asters, MD  oxyCODONE-acetaminophen (PERCOCET/ROXICET) 5-325 MG tablet Take 1-2 tablets by mouth every 6 (six) hours as needed for severe pain. 09/01/20   Robinson, [place] N, PA-C      Allergies    Patient has no known allergies.    Review of Systems   Review of Systems  All other systems reviewed and are negative.  Physical Exam Updated Vital Signs BP 134/90    Pulse 69    Temp 98.7 F (37.1 C) (Oral)    Resp 20     Ht 1.803 m (5\' 11" )    Wt 72.6 kg    SpO2 92%    BMI 22.32 kg/m  Physical Exam Vitals reviewed.  HENT:     Head: Normocephalic.     Right Ear: External ear normal.     Left Ear: External ear normal.     Nose: Nose normal.     Mouth/Throat:     Pharynx: Oropharynx is clear.  Eyes:     Pupils: Pupils are equal, round, and reactive to light.  Cardiovascular:     Rate and Rhythm: Normal rate and regular rhythm.     Pulses: Normal pulses.     Heart sounds: Normal heart sounds.  Pulmonary:     Effort: Pulmonary effort is normal.     Breath sounds: Normal breath sounds.  Abdominal:     Palpations: Abdomen is soft.  Musculoskeletal:     Cervical back: Normal range of motion.     Comments: No obvious deformity TTP left hip and groin No redness or rash No swelling or fluctuance Left thigh without swelling or cord Left knee, ankle foot normal with pulses intact Strengt 5/5 hip, knee and ankle Sensation intact left great toe, foot Dp puls 1+  Skin:    General: Skin is warm.     Capillary Refill: Capillary refill takes less than 2 seconds.  Neurological:     General: No focal deficit present.  Mental Status: He is alert.  Psychiatric:        Speech: Speech is rapid and pressured.    ED Results / Procedures / Treatments   Labs (all labs ordered are listed, but only abnormal results are displayed) Labs Reviewed - No data to display  EKG None  Radiology DG Hip Unilat W or Wo Pelvis 2-3 Views Left  Result Date: 07/30/2021 CLINICAL DATA:  Left groin and hip pain 2 days. Previous left hip arthroplasty. EXAM: DG HIP (WITH OR WITHOUT PELVIS) 2-3V LEFT COMPARISON:  06/09/2010 and 08/10/2012 FINDINGS: Left total hip arthroplasty intact and normally located. Minimal degenerative change of the right hip. Mild degenerative change of the spine. IMPRESSION: 1. No acute findings. 2. Left total hip arthroplasty intact. Electronically Signed   By: Elberta Fortis M.D.   On: 07/30/2021 11:00     Procedures Procedures    Medications Ordered in ED Medications  ketorolac (TORADOL) 15 MG/ML injection 15 mg (15 mg Intravenous Given 07/30/21 1132)    ED Course/ Medical Decision Making/ A&P Clinical Course as of 07/30/21 1808  Fri Jul 30, 2021  1114 Left hip x-Maxxon Schwanke with pelvis personally reviewed and no evidence of acute abnormality noted on my personal review and interpretation Radiologist does not note any acute abnormalities [DR]    Clinical Course User Index [DR] Margarita Grizzle, MD                           Medical Decision Making 71 year old male presents today complaining of left hip pain stating that he thinks that his left hip is dislocated X-rays obtained show no evidence of dislocation or other acute traumatic etiology. Other etiologies such as acute infection are considered but low index of suspicion given that there is no warmth, erythema, or fever.  Patient has HIV but has been followed closely by ID and has been taking his medications and had normal blood counts Has some pain that radiates down into the medial leg.  There is no evidence of back pain or injury.  However this could come from a disc type pain but does not explain the tenderness over the hip in the groin Vascular injuries considered but pulses are intact and equal from the other side Distal to the pain there is no abnormality noted. He has full active range of motion. This may represent some inflammation of the hip and pelvis. There is not appear to be an acute intra-abdominal or back etiology at this time. Plan treatment with anti-inflammatories and will give crutches.  Patient advised regarding need for close follow-up.  Amount and/or Complexity of Data Reviewed Radiology: ordered.  Risk Prescription drug management.           Final Clinical Impression(s) / ED Diagnoses Final diagnoses:  Left hip pain    Rx / DC Orders ED Discharge Orders     None         Margarita Grizzle,  MD 07/30/21 [phone]

## 2021-07-31 LAB — LIPID PANEL
Cholesterol: 166 mg/dL (ref <200)
HDL: 53 mg/dL (ref >=40)
LDL Cholesterol (Calc): 91 mg/dL (calc)
Non-HDL Cholesterol (Calc): 113 mg/dL (calc) (ref <130)
Total CHOL/HDL Ratio: 3.1 (calc) (ref <5.0)
Triglycerides: 128 mg/dL (ref <150)

## 2021-07-31 LAB — CBC WITH DIFFERENTIAL/PLATELET
Absolute Monocytes: 945 cells/uL (ref 200–950)
Basophils Absolute: 48 cells/uL (ref 0–200)
Basophils Relative: 0.7 %
Eosinophils Absolute: 48 cells/uL (ref 15–500)
Eosinophils Relative: 0.7 %
HCT: 44.8 % (ref 38.5–50.0)
Hemoglobin: 14.2 g/dL (ref 13.2–17.1)
Lymphs Abs: 3650 cells/uL (ref 850–3900)
MCH: 27.5 pg (ref 27.0–33.0)
MCHC: 31.7 g/dL — ABNORMAL LOW (ref 32.0–36.0)
MCV: 86.8 fL (ref 80.0–100.0)
MPV: 10.7 fL (ref 7.5–12.5)
Monocytes Relative: 13.7 %
Neutro Abs: 2208 cells/uL (ref 1500–7800)
Neutrophils Relative %: 32 %
Platelets: 253 10*3/uL (ref 140–400)
RBC: 5.16 10*6/uL (ref 4.20–5.80)
RDW: 12.6 % (ref 11.0–15.0)
Total Lymphocyte: 52.9 %
WBC: 6.9 10*3/uL (ref 3.8–10.8)

## 2021-07-31 LAB — BASIC METABOLIC PANEL
BUN: 20 mg/dL (ref 7–25)
CO2: 30 mmol/L (ref 20–32)
Calcium: 9.1 mg/dL (ref 8.6–10.3)
Chloride: 109 mmol/L (ref 98–110)
Creat: 1.19 mg/dL (ref 0.70–1.28)
Glucose, Bld: 102 mg/dL — ABNORMAL HIGH (ref 65–99)
Potassium: 4.7 mmol/L (ref 3.5–5.3)
Sodium: 144 mmol/L (ref 135–146)

## 2021-07-31 LAB — T-HELPER CELLS (CD4) COUNT (NOT AT ARMC)
Absolute CD4: 643 cells/uL (ref 490–1740)
CD4 T Helper %: 18 % — ABNORMAL LOW (ref 30–61)
Total lymphocyte count: 3564 cells/uL (ref 850–3900)

## 2021-07-31 LAB — HIV-1 INTEGRASE GENOTYPE

## 2021-07-31 LAB — HIV RNA, RTPCR W/R GT (RTI, PI,INT)
HIV 1 RNA Quant: 24100 copies/mL — ABNORMAL HIGH
HIV-1 RNA Quant, Log: 4.38 Log copies/mL — ABNORMAL HIGH

## 2021-07-31 LAB — HIV-1 GENOTYPE: HIV-1 Genotype: DETECTED — AB

## 2021-07-31 LAB — RPR: RPR Ser Ql: NONREACTIVE

## 2021-08-02 ENCOUNTER — Telehealth: Payer: Self-pay | Admitting: Pharmacist

## 2021-08-02 NOTE — Telephone Encounter (Signed)
HIV Genotype Date: 2/14  RT Mutations  K219KE, K103N, A98S, R211KR  PI Mutations  I13V, G16E, L63H, I64V, V77I  Integrase Mutations  M50I   Interpretation of Genotype Data per Stanford HIV Drug Resistance Database:  Nucleoside RTIs  Abacavir - None Zidovudine - Potential low-level resistance Emtricitabine - None Lamivudine - None Tenofovir - None   Non-Nucleoside RTIs  Doravirine - None Efavirenz - High-level resistance Etravirine - None Nevirapine - High-level resistance Rilpivirine - None   Protease Inhibitors  Atazanavir - None Darunavir - None Lopinavir - None   Integrase Inhibitors  Bictegravir - None Cabotegravir - None Dolutegravir - None Elvitegravir - None Raltegravir - None   Patient would be eligible for Biktarvy based on this genotype. Could not find significant resistance mutations on past genotypes.  Margarite Gouge, PharmD, CPP Clinical Pharmacist Practitioner Infectious Diseases Clinical Pharmacist [facility] for Infectious Disease

## 2021-08-19 ENCOUNTER — Ambulatory Visit: Payer: Medicare Other | Admitting: Internal Medicine

## 2021-08-25 ENCOUNTER — Telehealth: Payer: Self-pay

## 2021-08-25 ENCOUNTER — Other Ambulatory Visit (HOSPITAL_COMMUNITY): Payer: Self-pay

## 2021-08-25 ENCOUNTER — Other Ambulatory Visit: Payer: Self-pay

## 2021-08-25 ENCOUNTER — Ambulatory Visit (INDEPENDENT_AMBULATORY_CARE_PROVIDER_SITE_OTHER): Payer: Medicare Other | Admitting: Internal Medicine

## 2021-08-25 VITALS — BP 127/82 | HR 73 | Resp 16 | Ht 71.0 in | Wt 168.0 lb

## 2021-08-25 DIAGNOSIS — B20 Human immunodeficiency virus [HIV] disease: Secondary | ICD-10-CM | POA: Diagnosis not present

## 2021-08-25 DIAGNOSIS — Z23 Encounter for immunization: Secondary | ICD-10-CM | POA: Diagnosis not present

## 2021-08-25 MED ORDER — BICTEGRAVIR-EMTRICITAB-TENOFOV 50-200-25 MG PO TABS: 1.0000 | ORAL_TABLET | Freq: Every day | ORAL | 11 refills | Status: DC

## 2021-08-25 MED ORDER — BICTEGRAVIR-EMTRICITAB-TENOFOV 50-200-25 MG PO TABS
1.0000 | ORAL_TABLET | Freq: Every day | ORAL | 11 refills | Status: DC
  Filled 2021-08-25: qty 30, 30d supply, fill #0

## 2021-08-25 NOTE — Progress Notes (Signed)
Explained that [name] is a one pill once daily medication with or without food and the importance of not missing any doses. Explained resistance and how it develops and why it is so important to take Biktarvy daily and not skip days or doses. Counseled patient to take it around the same time each day. Cautioned on possible side effects the first week or so including nausea, diarrhea, dizziness, and headaches but that they should resolve after the first couple of weeks. I reviewed patient medications and found no drug interactions. Counseled patient to separate Biktarvy from divalent cations including multivitamins. Discussed with patient to call clinic if he starts a new medication or herbal supplement.  ? ?Margarite Gouge, PharmD, CPP ?Clinical Pharmacist Practitioner ?Infectious Diseases Clinical Pharmacist ?[facility] ? ? ?

## 2021-08-25 NOTE — Assessment & Plan Note (Signed)
Not surprisingly, his viral load had reactivated after being off of his Symtuza 1 month ago.  His resistant assay suggests that he will do well with Biktarvy.  I have asked our pharmacy staff to see what the problem is for him getting refills of his medication.  He will get repeat blood work today and follow-up in 6 months. ?

## 2021-08-25 NOTE — Addendum Note (Signed)
Addended by: Theresia Majors A on: 08/25/2021 12:00 PM ? ? Modules accepted: Orders ? ?

## 2021-08-25 NOTE — Progress Notes (Signed)
? ?   ? ? ? ? ?Patient Active Problem List  ? Diagnosis Date Noted  ? Cirrhosis (HCC) 05/27/2019  ?  Priority: High  ? Human immunodeficiency virus (HIV) disease (HCC) 07/03/2006  ?  Priority: High  ? Chronic hepatitis C virus infection (HCC) 07/03/2006  ?  Priority: High  ? Macrocytic anemia 05/15/2013  ?  Priority: Medium   ? Hyperglycemia 05/15/2013  ?  Priority: Medium   ? CIGARETTE SMOKER 07/03/2006  ?  Priority: Medium   ? Knee pain 12/11/2019  ? 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 Poor dentition 10/27/2015  ? Avascular necrosis of hip (HCC) 07/20/2012  ? Chronic bronchitis (HCC) 06/18/2012  ? HIP FRACTURE, LEFT 06/15/2010  ? ATYPICAL MYCOBACTERIAL INFECTION 12/04/2008  ? TENDINITIS, THUMB 05/20/2008  ? Condyloma acuminatum 07/03/2006  ? ERECTILE DYSFUNCTION 07/03/2006  ? Polysubstance abuse (HCC) 07/03/2006  ? HX, PERSONAL, TUBERCULOSIS 07/03/2006  ? HEPATITIS B, HX OF 07/03/2006  ? ? ?Patient's Medications  ?New Prescriptions  ? No medications on file  ?Previous Medications  ? DARUNAVIR-COBICISTAT-EMTRICITABINE-TENOFOVIR ALAFENAMIDE (SYMTUZA) 800-150-200-10 MG TABS    Take 1 tablet by mouth daily with breakfast.  ? METOPROLOL TARTRATE (LOPRESSOR) 50 MG TABLET    Take 1 tablet (50 mg total) by mouth 2 (two) times daily.  ? ONDANSETRON (ZOFRAN-ODT) 4 MG DISINTEGRATING TABLET    DISSOLVE ONE TABLET BY MOUTH EVERY 8 HOURS AS NEEDED FOR NAUSEA AND VOMITING  ? OXYCODONE-ACETAMINOPHEN (PERCOCET/ROXICET) 5-325 MG TABLET    Take 1-2 tablets by mouth every 6 (six) hours as needed for severe pain.  ?Modified Medications  ? No medications on file  ?Discontinued Medications  ? No medications on file  ? ? ?Subjective: ?Remus is in for his routine HIV follow-up visit.  He ran out of his Symtuza last fall and restarted it after his visit here 1 month ago.  Resistance assays at the time of his last visit showed: ? ?HIV Genotype Date: 2/14 ?  ?RT Mutations  K219KE, K103N, A98S, R211KR  ?PI Mutations  I13V, G16E, L63H,  I64V, V77I  ?Integrase Mutations  M50I  ?  ?Interpretation of Genotype Data per Stanford HIV Drug Resistance Database: ?  ?Nucleoside RTIs  ?Abacavir - None ?Zidovudine - Potential low-level resistance ?Emtricitabine - None ?Lamivudine - None ?Tenofovir - None  ?  ?Non-Nucleoside RTIs  ?Doravirine - None ?Efavirenz - High-level resistance ?Etravirine - None ?Nevirapine - High-level resistance ?Rilpivirine - None  ?  ?Protease Inhibitors  ?Atazanavir - None ?Darunavir - None ?Lopinavir - None  ?  ?Integrase Inhibitors  ?Bictegravir - None ?Cabotegravir - None ?Dolutegravir - None ?Elvitegravir - None ?Raltegravir - None  ? ?He was given a temporary supply of Symtuza which she ran out of 2 days ago.  He says that he went to his pharmacy and was told that he would have to pay $10 to get his refill.  He says that he could not afford that so he has been out the past 2 days.  Fortunately, he is feeling well. ? ?Review of Systems: ?Review of Systems  ?Constitutional:  Negative for fever and weight loss.  ?Psychiatric/Behavioral:  Negative for depression. The patient is not nervous/anxious.   ? ?Past Medical History:  ?Diagnosis Date  ? 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 Bronchitis, chronic (HCC)   ? per office note 06/18/12 Dr  [name]  ? Condyloma acuminatum   ? Hepatitis   ? HX   B and C  ? HIV disease (  HCC)   ? Pneumonia   ? x 2  ? Substance abuse (HCC)   ? 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,  ALCOHOL, CIGARETTE ABUSE  ? Tuberculosis   ? per patient  ? ? ?Social History  ? ?Tobacco Use  ? Smoking status: Some Days  ?  Packs/day: 0.20  ?  Years: 50.00  ?  Pack years: 10.00  ?  Types: Cigarettes  ? Smokeless tobacco: Never  ?Substance Use Topics  ? Alcohol use: Yes  ?  Alcohol/week: 42.0 standard drinks  ?  Types: 42 Standard drinks or equivalent per week  ?  Comment: 6-12oz beers  ? Drug use: Yes  ?  Frequency: 3.0 times per week  ?  Types: "Crack" cocaine  ?  Comment: last smoked crack 3 days ago - 10/27/15 pt stated that it was a month ago  he last used  ? ? ?No family history on file. ? ?No Known Allergies ? ?Health Maintenance  ?Topic Date Due  ? COVID-19 Vaccine (1) Never done  ? TETANUS/TDAP  Never done  ? Zoster Vaccines- Shingrix (1 of 2) Never done  ? COLONOSCOPY (Pts 45-6553yrs Insurance coverage will need to be confirmed)  Never done  ? INFLUENZA VACCINE  01/04/2021  ? Pneumonia Vaccine 5065+ Years old  Completed  ? Hepatitis C Screening  Completed  ? HPV VACCINES  Aged Out  ? ? ?Objective: ? ?Vitals:  ? 08/25/21 0843  ?BP: 127/82  ?Pulse: 73  ?Resp: 16  ?SpO2: 97%  ?Weight: 168 lb (76.2 kg)  ?Height: 5\' 11"  (1.803 m)  ? ?Body mass index is 23.43 kg/m?. ? ?Physical Exam ?Constitutional:   ?   Comments: His spirits are good.  ?Cardiovascular:  ?   Rate and Rhythm: Normal rate and regular rhythm.  ?   Heart sounds: No murmur heard. ?Pulmonary:  ?   Effort: Pulmonary effort is normal.  ?   Breath sounds: Normal breath sounds.  ?Psychiatric:     ?   Mood and Affect: Mood normal.  ? ? ?Lab Results ?Lab Results  ?Component Value Date  ? WBC 6.9 07/20/2021  ? HGB 14.2 07/20/2021  ? HCT 44.8 07/20/2021  ? MCV 86.8 07/20/2021  ? PLT 253 07/20/2021  ?  ?Lab Results  ?Component Value Date  ? CREATININE 1.19 07/20/2021  ? BUN 20 07/20/2021  ? NA 144 07/20/2021  ? K 4.7 07/20/2021  ? CL 109 07/20/2021  ? CO2 30 07/20/2021  ?  ?Lab Results  ?Component Value Date  ? ALT 21 09/01/2020  ? AST 29 09/01/2020  ? GGT 84 (H) 03/09/2017  ? ALKPHOS 49 09/01/2020  ? BILITOT 0.7 09/01/2020  ?  ?Lab Results  ?Component Value Date  ? CHOL 166 07/20/2021  ? HDL 53 07/20/2021  ? LDLCALC 91 07/20/2021  ? TRIG 128 07/20/2021  ? CHOLHDL 3.1 07/20/2021  ? ?Lab Results  ?Component Value Date  ? LABRPR NON-REACTIVE 07/20/2021  ? ?HIV 1 RNA Quant  ?Date Value  ?07/20/2021 24,100 copies/mL (H)  ?06/16/2020 <20 Copies/mL  ?12/11/2019 41 copies/mL (H)  ? ?CD4 T Cell Abs (/uL)  ?Date Value  ?06/16/2020 442  ?12/11/2019 571  ?05/14/2019 723  ? ?  ?Problem List Items Addressed This Visit    ? ?  ? High  ? Human immunodeficiency virus (HIV) disease (HCC)  ?  Not surprisingly, his viral load had reactivated after being off of his Symtuza 1 month ago.  His resistant assay suggests that he  will do well with Biktarvy.  I have asked our pharmacy staff to see what the problem is for him getting refills of his medication.  He will get repeat blood work today and follow-up in 6 months. ?  ?  ? Relevant Orders  ? T-helper cells (CD4) count (not at [facility])  ? HIV-1 RNA quant-no reflex-bld  ? ? ? ? ?Cliffton Asters, MD ?[facility] for Infectious Disease ?[facility] ?[phone] pager   [phone] cell ?08/25/2021, 9:01 AM ? ?

## 2021-08-25 NOTE — Telephone Encounter (Signed)
[facility] Patient Advocate Encounter ?  ?I was successful in securing patient a $ 7500.00 grant from [employer] to provide copayment coverage for Biktarvy.  The patient's out of pocket cost will be $0.00 monthly.   ?  ?I have spoken with the patient.   ? ?  ? ? ? ?Dates of Eligibility: 08/25/21 through 06/05/22 ? ?Patient knows to call the office with questions or concerns. ? ?Ileene Patrick, CPhT ?Specialty Pharmacy Patient Advocate ?[facility] Infect[facility] ?Phone: [phone] ?Fax:  [phone]  ?

## 2021-08-26 ENCOUNTER — Other Ambulatory Visit: Payer: Self-pay | Admitting: Pharmacist

## 2021-08-26 DIAGNOSIS — B20 Human immunodeficiency virus [HIV] disease: Secondary | ICD-10-CM

## 2021-08-26 LAB — T-HELPER CELLS (CD4) COUNT (NOT AT ARMC)
CD4 % Helper T Cell: 15 % — ABNORMAL LOW (ref 33–65)
CD4 T Cell Abs: 398 /uL — ABNORMAL LOW (ref 400–1790)

## 2021-08-26 MED ORDER — BICTEGRAVIR-EMTRICITAB-TENOFOV 50-200-25 MG PO TABS: 1.0000 | ORAL_TABLET | Freq: Every day | ORAL | 0 refills | Status: AC

## 2021-08-26 NOTE — Progress Notes (Signed)
Medication Samples have been provided to the patient.  Drug name: Biktarvy        Strength: 50/200/25 mg       Qty: 7 tablets (1 bottles) LOT: [id number]   Exp.Date: 10/24  Dosing instructions: Take one tablet by mouth once daily  The patient has been instructed regarding the correct time, dose, and frequency of taking this medication, including desired effects and most common side effects.   Sotirios Navarro, PharmD, CPP Clinical Pharmacist Practitioner Infectious Diseases Clinical Pharmacist [facility]  

## 2021-08-28 LAB — HIV-1 RNA QUANT-NO REFLEX-BLD
HIV 1 RNA Quant: 53 Copies/mL — ABNORMAL HIGH
HIV-1 RNA Quant, Log: 1.72 Log cps/mL — ABNORMAL HIGH

## 2021-08-31 ENCOUNTER — Other Ambulatory Visit: Payer: Self-pay | Admitting: Pharmacist

## 2021-08-31 DIAGNOSIS — B20 Human immunodeficiency virus [HIV] disease: Secondary | ICD-10-CM

## 2021-08-31 MED ORDER — BICTEGRAVIR-EMTRICITAB-TENOFOV 50-200-25 MG PO TABS: 1.0000 | ORAL_TABLET | Freq: Every day | ORAL | 11 refills | Status: DC

## 2021-08-31 NOTE — Progress Notes (Signed)
Patient requested mail order pharmacy and down to one pill today. Will resend [employer] to [facility] in [place] for delivery later this week. Patient has been advised to come to clinic and get more Biktarvy samples in the meantime. ? ?Alfonse Spruce, PharmD, CPP ?Clinical Pharmacist Practitioner ?Infectious Diseases Clinical Pharmacist ?[facility] ? ?

## 2021-09-06 ENCOUNTER — Other Ambulatory Visit: Payer: Self-pay | Admitting: Pharmacist

## 2021-09-06 DIAGNOSIS — B20 Human immunodeficiency virus [HIV] disease: Secondary | ICD-10-CM

## 2021-09-06 MED ORDER — BIKTARVY 50-200-25 MG PO TABS
1.0000 | ORAL_TABLET | Freq: Every day | ORAL | 0 refills | Status: AC
Start: 2021-09-01 — End: 2021-09-15

## 2021-09-06 NOTE — Progress Notes (Signed)
Medication Samples have been provided to the patient. ° °Drug name: Biktarvy        °Strength: 50/200/25 mg       °Qty: 14 tablets (2 bottles)   °LOT: [id number]   °Exp.Date: 03/07/23 ° °Dosing instructions: Take one tablet by mouth once daily ° °The patient has been instructed regarding the correct time, dose, and frequency of taking this medication, including desired effects and most common side effects.  ° °Ouita Nish L. Lanya Bucks, PharmD, BCIDP, AAHIVP, CPP °Clinical Pharmacist Practitioner °Infectious Diseases Clinical Pharmacist °[facility] °05/18/2020, 10:07 AM  °

## 2022-03-01 ENCOUNTER — Ambulatory Visit: Payer: Medicare Other | Admitting: Internal Medicine

## 2022-07-26 ENCOUNTER — Ambulatory Visit (INDEPENDENT_AMBULATORY_CARE_PROVIDER_SITE_OTHER): Payer: Medicare Other | Admitting: Internal Medicine

## 2022-07-26 ENCOUNTER — Ambulatory Visit (INDEPENDENT_AMBULATORY_CARE_PROVIDER_SITE_OTHER): Payer: Medicare Other

## 2022-07-26 ENCOUNTER — Other Ambulatory Visit: Payer: Self-pay

## 2022-07-26 ENCOUNTER — Encounter: Payer: Self-pay | Admitting: Internal Medicine

## 2022-07-26 ENCOUNTER — Other Ambulatory Visit (HOSPITAL_COMMUNITY): Payer: Self-pay

## 2022-07-26 VITALS — BP 148/80 | HR 57 | Temp 98.4°F | Wt 169.0 lb

## 2022-07-26 DIAGNOSIS — B20 Human immunodeficiency virus [HIV] disease: Secondary | ICD-10-CM | POA: Diagnosis not present

## 2022-07-26 DIAGNOSIS — Z23 Encounter for immunization: Secondary | ICD-10-CM

## 2022-07-26 MED ORDER — BICTEGRAVIR-EMTRICITAB-TENOFOV 50-200-25 MG PO TABS: 1.0000 | ORAL_TABLET | Freq: Every day | ORAL | 11 refills | Status: DC

## 2022-07-26 NOTE — Progress Notes (Signed)
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   Poor dentition 10/27/2015   Avascular necrosis of hip ([place]) 07/20/2012   Chronic bronchitis ([place]) 06/18/2012   HIP FRACTURE, LEFT 06/15/2010   ATYPICAL MYCOBACTERIAL INFECTION 12/04/2008   TENDINITIS, THUMB 05/20/2008   Condyloma acuminatum 07/03/2006   ERECTILE DYSFUNCTION 07/03/2006   Polysubstance abuse ([place]) 07/03/2006   HX, PERSONAL, TUBERCULOSIS 07/03/2006   HEPATITIS B, HX OF 07/03/2006    Patient's Medications  New Prescriptions   No medications on file  Previous Medications   METOPROLOL TARTRATE (LOPRESSOR) 50 MG TABLET    Take 1 tablet (50 mg total) by mouth 2 (two) times daily.   ONDANSETRON (ZOFRAN-ODT) 4 MG DISINTEGRATING TABLET    DISSOLVE ONE TABLET BY MOUTH EVERY 8 HOURS AS NEEDED FOR NAUSEA AND VOMITING   OXYCODONE-ACETAMINOPHEN (PERCOCET/ROXICET) 5-325 MG TABLET    Take 1-2 tablets by mouth every 6 (six) hours as needed for severe pain.  Modified Medications   Modified Medication Previous Medication   BICTEGRAVIR-EMTRICITABINE-TENOFOVIR AF (BIKTARVY) 50-200-25 MG TABS TABLET bictegravir-emtricitabine-tenofovir AF (BIKTARVY) 50-200-25 MG TABS tablet      Take 1 tablet by mouth daily.    Take 1 tablet by mouth daily.  Discontinued Medications   No medications on file    Subjective: [name] is in for his routine HIV follow-up visit.  He had trouble getting Symtuza last year and we changed him to [employer].  He was initially taking samples but then had his medication mailed to him each  month.  He says that he only has about 3 pills left and that his pharmacy did not mail it to him this month.  He has not called his pharmacy.  He is not taking any other medications.  He says that he is feeling well other than 2 recent falls when he tripped on an uneven sidewalk.  Fortunately he did not have any injuries.  He says that he estimates that he has missed about 4 doses of Biktarvy in the past month.  This generally occurs when the person who gives him a ride to work shows up earlier than expected and he forgets to take it.  Review of Systems: Review of Systems  Constitutional:  Negative for fever and weight loss.  Respiratory:  Negative for cough and shortness of breath.   Cardiovascular:  Negative for chest pain.  Musculoskeletal:  Positive for falls.  Psychiatric/Behavioral:  Negative for depression.     Past Medical History:  Diagnosis Date   Arthritis    left hip   Bronchitis, chronic ([place])    per office note 06/18/12 Dr  Megan Salon   Condyloma acuminatum    Hepatitis    HX   B and C   HIV disease ([place])    Pneumonia    x 2   Substance abuse ([place])    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,  ALCOHOL, CIGARETTE ABUSE   Tuberculosis    per patient    Social History  Tobacco Use   Smoking status: Some Days    Packs/day: 0.20    Years: 50.00    Total pack years: 10.00    Types: Cigarettes   Smokeless tobacco: Never  Substance Use Topics   Alcohol use: Yes    Alcohol/week: 42.0 standard drinks of alcohol    Types: 42 Standard drinks or equivalent per week    Comment: 6-12oz beers   Drug use: Yes    Frequency: 3.0 times per week    Types: "Crack" cocaine    Comment: last smoked crack 3 days ago - 10/27/15 pt stated that it was a month ago he last used    No family history on file.  No Known Allergies  Health Maintenance  Topic Date Due   Medicare Annual Wellness (AWV)  Never done   COVID-19 Vaccine (1) Never done   DTaP/Tdap/Td (1 - Tdap) Never done   Zoster Vaccines-  Shingrix (1 of 2) Never done   COLONOSCOPY (Pts 45-33yr Insurance coverage will need to be confirmed)  Never done   INFLUENZA VACCINE  01/04/2022   Pneumonia Vaccine 72 Years old  Completed   Hepatitis C Screening  Completed   HPV VACCINES  Aged Out    Objective:  Vitals:   07/26/22 1411  BP: (!) 148/80  Pulse: (!) 57  Temp: 98.4 F (36.9 C)  TempSrc: Oral  SpO2: 99%  Weight: 169 lb (76.7 kg)   Body mass index is 23.57 kg/m.  Physical Exam Constitutional:      Comments: He is pleasant and talkative.  Cardiovascular:     Rate and Rhythm: Normal rate and regular rhythm.     Heart sounds: No murmur heard. Pulmonary:     Effort: Pulmonary effort is normal.     Breath sounds: Normal breath sounds.  Psychiatric:        Mood and Affect: Mood normal.     Lab Results Lab Results  Component Value Date   WBC 6.9 07/20/2021   HGB 14.2 07/20/2021   HCT 44.8 07/20/2021   MCV 86.8 07/20/2021   PLT 253 07/20/2021    Lab Results  Component Value Date   CREATININE 1.19 07/20/2021   BUN 20 07/20/2021   NA 144 07/20/2021   K 4.7 07/20/2021   CL 109 07/20/2021   CO2 30 07/20/2021    Lab Results  Component Value Date   ALT 21 09/01/2020   AST 29 09/01/2020   GGT 84 (H) 03/09/2017   ALKPHOS 49 09/01/2020   BILITOT 0.7 09/01/2020    Lab Results  Component Value Date   CHOL 166 07/20/2021   HDL 53 07/20/2021   LDLCALC 91 07/20/2021   TRIG 128 07/20/2021   CHOLHDL 3.1 07/20/2021   Lab Results  Component Value Date   LABRPR NON-REACTIVE 07/20/2021   HIV 1 RNA Quant  Date Value  08/25/2021 53 Copies/mL (H)  07/20/2021 24,100 copies/mL (H)  06/16/2020 <20 Copies/mL   CD4 T Cell Abs (/uL)  Date Value  08/25/2021 398 (L)  06/16/2020 442  12/11/2019 571     Problem List Items Addressed This Visit       High   Human immunodeficiency virus (HIV) disease ([facility]    His infection was coming under good control after starting [place]1 year ago.  Had updated  lab work today I have sent in refills of his [place]  He received an influenza vaccine and an updated COVID-vaccine here today.  He will follow-up in 6  weeks.      Relevant Medications   bictegravir-emtricitabine-tenofovir AF (BIKTARVY) 50-200-25 MG TABS tablet   Other Relevant Orders   T-helper cells (CD4) count (not at [facility])   HIV-1 RNA quant-no reflex-bld   CBC   Comprehensive metabolic panel   RPR   Lipid panel      Michel Bickers, MD [facility] for [place] [phone] pager   [phone] cell 07/26/2022, 2:31 PM

## 2022-07-26 NOTE — Assessment & Plan Note (Signed)
His infection was coming under good control after starting [place] 1 year ago.  Had updated lab work today I have sent in refills of his [place].  He received an influenza vaccine and an updated COVID-vaccine here today.  He will follow-up in 6 weeks.

## 2022-07-27 LAB — T-HELPER CELLS (CD4) COUNT (NOT AT ARMC)
CD4 % Helper T Cell: 20 % — ABNORMAL LOW (ref 33–65)
CD4 T Cell Abs: 532 /uL (ref 400–1790)

## 2022-07-28 LAB — LIPID PANEL
Cholesterol: 184 mg/dL (ref <200)
HDL: 59 mg/dL (ref >=40)
LDL Cholesterol (Calc): 103 mg/dL (calc) — ABNORMAL HIGH
Non-HDL Cholesterol (Calc): 125 mg/dL (calc) (ref <130)
Total CHOL/HDL Ratio: 3.1 (calc) (ref <5.0)
Triglycerides: 127 mg/dL (ref <150)

## 2022-07-28 LAB — CBC
HCT: 45.6 % (ref 38.5–50.0)
Hemoglobin: 14.9 g/dL (ref 13.2–17.1)
MCH: 28.1 pg (ref 27.0–33.0)
MCHC: 32.7 g/dL (ref 32.0–36.0)
MCV: 86 fL (ref 80.0–100.0)
MPV: 10.5 fL (ref 7.5–12.5)
Platelets: 278 10*3/uL (ref 140–400)
RBC: 5.3 10*6/uL (ref 4.20–5.80)
RDW: 12.4 % (ref 11.0–15.0)
WBC: 8.4 10*3/uL (ref 3.8–10.8)

## 2022-07-28 LAB — COMPREHENSIVE METABOLIC PANEL
AG Ratio: 1.6 (calc) (ref 1.0–2.5)
ALT: 17 U/L (ref 9–46)
AST: 21 U/L (ref 10–35)
Albumin: 4.2 g/dL (ref 3.6–5.1)
Alkaline phosphatase (APISO): 55 U/L (ref 35–144)
BUN: 11 mg/dL (ref 7–25)
CO2: 29 mmol/L (ref 20–32)
Calcium: 9.6 mg/dL (ref 8.6–10.3)
Chloride: 106 mmol/L (ref 98–110)
Creat: 0.98 mg/dL (ref 0.70–1.28)
Globulin: 2.6 g/dL (calc) (ref 1.9–3.7)
Glucose, Bld: 119 mg/dL — ABNORMAL HIGH (ref 65–99)
Potassium: 4.7 mmol/L (ref 3.5–5.3)
Sodium: 143 mmol/L (ref 135–146)
Total Bilirubin: 0.3 mg/dL (ref 0.2–1.2)
Total Protein: 6.8 g/dL (ref 6.1–8.1)

## 2022-07-28 LAB — HIV-1 RNA QUANT-NO REFLEX-BLD
HIV 1 RNA Quant: <20 Copies/mL — ABNORMAL HIGH
HIV-1 RNA Quant, Log: <1.3 Log cps/mL — ABNORMAL HIGH

## 2022-07-28 LAB — RPR: RPR Ser Ql: NONREACTIVE

## 2022-09-06 ENCOUNTER — Ambulatory Visit: Payer: Medicare Other | Admitting: Internal Medicine

## 2022-09-06 ENCOUNTER — Telehealth: Payer: Self-pay

## 2022-09-06 NOTE — Telephone Encounter (Signed)
Called patient to see if he would be able to make it to today's appointment, no answer.   Beryle Flock, RN

## 2022-09-20 ENCOUNTER — Telehealth: Payer: Self-pay

## 2022-09-20 ENCOUNTER — Ambulatory Visit: Payer: Medicare Other | Admitting: Internal Medicine

## 2022-09-20 NOTE — Telephone Encounter (Signed)
Called patient regarding missed appointment. Call was answered by male who states patient was not with her. Requested she have him call office to reschedule appointment. Did not disclose any patient information during call. Per Dr. [name] okay to reschedule appointment as phone visit. Juanita Laster, RMA

## 2022-09-28 ENCOUNTER — Ambulatory Visit: Payer: Medicare Other | Admitting: Internal Medicine

## 2022-10-19 ENCOUNTER — Other Ambulatory Visit: Payer: Self-pay

## 2022-10-19 DIAGNOSIS — K121 Other forms of stomatitis: Secondary | ICD-10-CM

## 2022-10-19 NOTE — Addendum Note (Signed)
Addended by: Linna Hoff D on: 10/19/2022 01:30 PM   Modules accepted: Orders

## 2022-11-02 ENCOUNTER — Ambulatory Visit (INDEPENDENT_AMBULATORY_CARE_PROVIDER_SITE_OTHER): Payer: Medicare Other | Admitting: Internal Medicine

## 2022-11-02 ENCOUNTER — Other Ambulatory Visit: Payer: Self-pay

## 2022-11-02 ENCOUNTER — Encounter: Payer: Self-pay | Admitting: Internal Medicine

## 2022-11-02 VITALS — BP 152/80 | HR 64 | Temp 97.0°F | Ht 71.0 in | Wt 155.0 lb

## 2022-11-02 DIAGNOSIS — K089 Disorder of teeth and supporting structures, unspecified: Secondary | ICD-10-CM

## 2022-11-02 DIAGNOSIS — B20 Human immunodeficiency virus [HIV] disease: Secondary | ICD-10-CM | POA: Diagnosis not present

## 2022-11-02 DIAGNOSIS — K7469 Other cirrhosis of liver: Secondary | ICD-10-CM

## 2022-11-02 DIAGNOSIS — Z7689 Persons encountering health services in other specified circumstances: Secondary | ICD-10-CM

## 2022-11-02 MED ORDER — BICTEGRAVIR-EMTRICITAB-TENOFOV 50-200-25 MG PO TABS: 1.0000 | ORAL_TABLET | Freq: Every day | ORAL | 11 refills | Status: DC

## 2022-11-02 NOTE — Assessment & Plan Note (Addendum)
He seems to be doing well on Biktarvy and will confirm with his labs today.  He does have remote resistance including a 184v but Biktarvy alone should suffice.  Reviewed previous labs with him.  He was referred for a PCP He otherwise can return in 6 months.

## 2022-11-02 NOTE — Assessment & Plan Note (Signed)
Cirrhosis noted on ultrasound from 2020.  I will consider ongoing HCC screening next visit.

## 2022-11-02 NOTE — Assessment & Plan Note (Signed)
Dental referral placed today for [name]. Information to schedule appointment completed today.   He reports he needs his teeth taken out.

## 2022-11-02 NOTE — Progress Notes (Signed)
   Subjective:    Patient ID: [name], male    DOB: Sep 24, 1950, 72 y.o.   MRN: [id number]  HPI [name] is here for follow up of HIV He has been on Biktarvy since last year and now getting it regularly via mail and taking daily.  He has no issues otherwise with taking or tolerating it.  He has no complaints today.  He does not yet have a PCP.     Review of Systems  Constitutional:  Negative for fatigue.  Gastrointestinal:  Negative for diarrhea.  Skin:  Negative for rash.       Objective:   Physical Exam Eyes:     General: No scleral icterus. Pulmonary:     Effort: Pulmonary effort is normal.  Neurological:     Mental Status: He is alert.   SH: + tobacco        Assessment & Plan:

## 2022-11-04 LAB — HIV-1 RNA QUANT-NO REFLEX-BLD
HIV 1 RNA Quant: 27 Copies/mL — ABNORMAL HIGH
HIV-1 RNA Quant, Log: 1.42 Log cps/mL — ABNORMAL HIGH

## 2022-11-04 LAB — T-HELPER CELL (CD4) - (RCID CLINIC ONLY)
CD4 % Helper T Cell: 19 % — ABNORMAL LOW (ref 33–65)
CD4 T Cell Abs: 503 /uL (ref 400–1790)

## 2023-02-17 NOTE — Progress Notes (Signed)
The 10-year ASCVD risk score (Arnett DK, et al., 2019) is: 13.5%   Values used to calculate the score:     Age: 72 years     Sex: Male     Is Non-Hispanic African American: Yes     Diabetic: No     Tobacco smoker: Yes     Systolic Blood Pressure: 105 mmHg     Is BP treated: No     HDL Cholesterol: 59 mg/dL     Total Cholesterol: 184 mg/dL  Sandie Ano, RN

## 2023-04-25 ENCOUNTER — Encounter (HOSPITAL_COMMUNITY): Payer: Self-pay | Admitting: *Deleted

## 2023-04-25 ENCOUNTER — Other Ambulatory Visit: Payer: Self-pay

## 2023-04-25 ENCOUNTER — Ambulatory Visit (HOSPITAL_COMMUNITY)
Admission: EM | Admit: 2023-04-25 | Discharge: 2023-04-25 | Disposition: A | Discharge status: Home / Self Care | Payer: Medicare Other | Attending: Nurse Practitioner | Admitting: Nurse Practitioner

## 2023-04-25 DIAGNOSIS — L03116 Cellulitis of left lower limb: Secondary | ICD-10-CM | POA: Insufficient documentation

## 2023-04-25 MED ORDER — SULFAMETHOXAZOLE-TRIMETHOPRIM 800-160 MG PO TABS: 1.0000 | ORAL_TABLET | Freq: Two times a day (BID) | ORAL | 0 refills | Status: DC

## 2023-04-25 NOTE — Discharge Instructions (Addendum)
You have been diagnosed with left lower leg cellulitis related to questionable  bite.  You have been prescribed Bactrim DS 800/160 mg 1 tablet twice daily for 7 days.  You are encouraged to take the medication as directed.  Urine culture has also been obtained to ensure that this is the best treatment option.  Due to your decreased immune system the recommendation is for you to follow-up with the wound center for further evaluation and to ensure proper healing.

## 2023-04-25 NOTE — ED Triage Notes (Signed)
Pt reports a possible insect bite a couple of days ago on the Lt leg. Surrounding skin is red and swollen.
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
## 2023-04-26 ENCOUNTER — Ambulatory Visit: Payer: Medicare Other | Admitting: Internal Medicine

## 2023-04-27 LAB — AEROBIC CULTURE W GRAM STAIN (SUPERFICIAL SPECIMEN)

## 2023-04-29 ENCOUNTER — Encounter (HOSPITAL_COMMUNITY): Payer: Self-pay | Admitting: Family Medicine

## 2023-04-29 ENCOUNTER — Inpatient Hospital Stay (HOSPITAL_COMMUNITY)
Admission: EM | Admit: 2023-04-29 | Discharge: 2023-05-01 | DRG: 603 | Disposition: A | Discharge status: Home / Self Care | Payer: Medicare Other | Attending: Family Medicine | Admitting: Family Medicine

## 2023-04-29 ENCOUNTER — Other Ambulatory Visit: Payer: Self-pay

## 2023-04-29 DIAGNOSIS — S80862A Insect bite (nonvenomous), left lower leg, initial encounter: Secondary | ICD-10-CM | POA: Diagnosis present

## 2023-04-29 DIAGNOSIS — T63301A Toxic effect of unspecified spider venom, accidental (unintentional), initial encounter: Secondary | ICD-10-CM | POA: Diagnosis present

## 2023-04-29 DIAGNOSIS — Z789 Other specified health status: Secondary | ICD-10-CM | POA: Insufficient documentation

## 2023-04-29 DIAGNOSIS — D8489 Other immunodeficiencies: Secondary | ICD-10-CM | POA: Diagnosis present

## 2023-04-29 DIAGNOSIS — Z21 Asymptomatic human immunodeficiency virus [HIV] infection status: Secondary | ICD-10-CM | POA: Diagnosis present

## 2023-04-29 DIAGNOSIS — Z9221 Personal history of antineoplastic chemotherapy: Secondary | ICD-10-CM | POA: Diagnosis not present

## 2023-04-29 DIAGNOSIS — L03116 Cellulitis of left lower limb: Principal | ICD-10-CM | POA: Diagnosis present

## 2023-04-29 DIAGNOSIS — Z85818 Personal history of malignant neoplasm of other sites of lip, oral cavity, and pharynx: Secondary | ICD-10-CM

## 2023-04-29 DIAGNOSIS — F17210 Nicotine dependence, cigarettes, uncomplicated: Secondary | ICD-10-CM | POA: Diagnosis present

## 2023-04-29 DIAGNOSIS — Z8739 Personal history of other diseases of the musculoskeletal system and connective tissue: Secondary | ICD-10-CM | POA: Diagnosis not present

## 2023-04-29 DIAGNOSIS — Z923 Personal history of irradiation: Secondary | ICD-10-CM

## 2023-04-29 DIAGNOSIS — Z8614 Personal history of Methicillin resistant Staphylococcus aureus infection: Secondary | ICD-10-CM | POA: Diagnosis not present

## 2023-04-29 DIAGNOSIS — Z931 Gastrostomy status: Secondary | ICD-10-CM | POA: Diagnosis not present

## 2023-04-29 DIAGNOSIS — L089 Local infection of the skin and subcutaneous tissue, unspecified: Secondary | ICD-10-CM | POA: Diagnosis present

## 2023-04-29 DIAGNOSIS — Z79899 Other long term (current) drug therapy: Secondary | ICD-10-CM | POA: Diagnosis not present

## 2023-04-29 DIAGNOSIS — C109 Malignant neoplasm of oropharynx, unspecified: Secondary | ICD-10-CM | POA: Diagnosis present

## 2023-04-29 DIAGNOSIS — C049 Malignant neoplasm of floor of mouth, unspecified: Secondary | ICD-10-CM | POA: Diagnosis present

## 2023-04-29 DIAGNOSIS — T148XXA Other injury of unspecified body region, initial encounter: Secondary | ICD-10-CM | POA: Diagnosis present

## 2023-04-29 DIAGNOSIS — K7460 Unspecified cirrhosis of liver: Secondary | ICD-10-CM | POA: Diagnosis present

## 2023-04-29 LAB — CBC WITH DIFFERENTIAL/PLATELET
Abs Immature Granulocytes: 0.03 10*3/uL (ref 0.00–0.07)
Basophils Absolute: 0 10*3/uL (ref 0.0–0.1)
Basophils Relative: 1 %
Eosinophils Absolute: 0.1 10*3/uL (ref 0.0–0.5)
Eosinophils Relative: 1 %
HCT: 38 % — ABNORMAL LOW (ref 39.0–52.0)
Hemoglobin: 11.9 g/dL — ABNORMAL LOW (ref 13.0–17.0)
Immature Granulocytes: 1 %
Lymphocytes Relative: 9 %
Lymphs Abs: 0.6 10*3/uL — ABNORMAL LOW (ref 0.7–4.0)
MCH: 26.6 pg (ref 26.0–34.0)
MCHC: 31.3 g/dL (ref 30.0–36.0)
MCV: 84.8 fL (ref 80.0–100.0)
Monocytes Absolute: 0.9 10*3/uL (ref 0.1–1.0)
Monocytes Relative: 14 %
Neutro Abs: 4.9 10*3/uL (ref 1.7–7.7)
Neutrophils Relative %: 74 %
Platelets: 414 10*3/uL — ABNORMAL HIGH (ref 150–400)
RBC: 4.48 MIL/uL (ref 4.22–5.81)
RDW: 16.8 % — ABNORMAL HIGH (ref 11.5–15.5)
WBC: 6.5 10*3/uL (ref 4.0–10.5)
nRBC: 0 % (ref 0.0–0.2)

## 2023-04-29 LAB — BASIC METABOLIC PANEL
Anion gap: 10 (ref 5–15)
BUN: 16 mg/dL (ref 8–23)
CO2: 25 mmol/L (ref 22–32)
Calcium: 9.6 mg/dL (ref 8.9–10.3)
Chloride: 100 mmol/L (ref 98–111)
Creatinine, Ser: 0.68 mg/dL (ref 0.61–1.24)
GFR, Estimated: >60 mL/min (ref >60)
Glucose, Bld: 95 mg/dL (ref 70–99)
Potassium: 4.6 mmol/L (ref 3.5–5.1)
Sodium: 135 mmol/L (ref 135–145)

## 2023-04-29 LAB — GLUCOSE, CAPILLARY
Glucose-Capillary: 127 mg/dL — ABNORMAL HIGH (ref 70–99)
Glucose-Capillary: 138 mg/dL — ABNORMAL HIGH (ref 70–99)
Glucose-Capillary: 84 mg/dL (ref 70–99)

## 2023-04-29 LAB — MAGNESIUM: Magnesium: 2.3 mg/dL (ref 1.7–2.4)

## 2023-04-29 LAB — PHOSPHORUS: Phosphorus: 3.1 mg/dL (ref 2.5–4.6)

## 2023-04-29 MED ORDER — EMTRICITABINE-TENOFOVIR AF 200-25 MG PO TABS
1.0000 | ORAL_TABLET | Freq: Every day | ORAL | Status: DC
  Filled 2023-04-29: qty 1

## 2023-04-29 MED ORDER — ENOXAPARIN SODIUM 40 MG/0.4ML IJ SOSY
40.0000 mg | PREFILLED_SYRINGE | INTRAMUSCULAR | Status: DC
Start: 2023-04-30 — End: 2023-05-01
  Administered 2023-04-30 – 2023-05-01 (×2): 40 mg via SUBCUTANEOUS
  Filled 2023-04-29 (×2): qty 0.4

## 2023-04-29 MED ORDER — FREE WATER
180.0000 mL | Freq: Every day | Status: DC
  Administered 2023-04-29 – 2023-05-01 (×11): 180 mL

## 2023-04-29 MED ORDER — AQUAPHOR EX OINT
TOPICAL_OINTMENT | Freq: Two times a day (BID) | CUTANEOUS | Status: DC | PRN
  Administered 2023-04-30: 1 via TOPICAL
  Filled 2023-04-29: qty 50

## 2023-04-29 MED ORDER — ACETAMINOPHEN 500 MG PO TABS: 500.0000 mg | ORAL_TABLET | Freq: Four times a day (QID) | ORAL | Status: DC | PRN

## 2023-04-29 MED ORDER — VANCOMYCIN HCL IN DEXTROSE 1-5 GM/200ML-% IV SOLN: 1000.0000 mg | Freq: Two times a day (BID) | INTRAVENOUS | Status: DC

## 2023-04-29 MED ORDER — VANCOMYCIN HCL 1250 MG/250ML IV SOLN
1250.0000 mg | INTRAVENOUS | Status: DC
  Administered 2023-04-30: 1250 mg via INTRAVENOUS
  Filled 2023-04-29 (×2): qty 250

## 2023-04-29 MED ORDER — DOLUTEGRAVIR SODIUM 50 MG PO TABS
50.0000 mg | ORAL_TABLET | Freq: Every day | ORAL | Status: DC
Start: 2023-04-30 — End: 2023-04-30
  Filled 2023-04-29: qty 1

## 2023-04-29 MED ORDER — OSMOLITE 1.5 CAL PO LIQD
237.0000 mL | Freq: Every day | ORAL | Status: DC
  Administered 2023-04-29 – 2023-05-01 (×11): 237 mL
  Filled 2023-04-29 (×16): qty 237

## 2023-04-29 MED ORDER — VANCOMYCIN HCL 1500 MG/300ML IV SOLN
1500.0000 mg | Freq: Once | INTRAVENOUS | Status: AC
  Administered 2023-04-29: 1500 mg via INTRAVENOUS
  Filled 2023-04-29: qty 300

## 2023-04-29 MED ORDER — EMTRICITABINE-TENOFOVIR AF 200-25 MG PO TABS: 1.0000 | ORAL_TABLET | Freq: Every day | ORAL | Status: DC

## 2023-04-29 NOTE — Assessment & Plan Note (Signed)
Patient-suspected spider bite wound on the lateral left lower leg. Wound found to be MRSA positive (bactrim sensitive) by urgent care, and patient was started on bactrim. Wound appearance and leg pain however have since worsened.  Afebrile on arrival without leukocytosis.  Received 1 dose of vancomycin in the ED.  Blood cultures x 2 drawn before vanc admin.  -Admit to FMTS with attending Dr. Pollie Meyer -Continue vancomycin per pharmacy -Wound care consult ordered -Vital signs per floor protocol -AM BMP, CBC

## 2023-04-29 NOTE — Hospital Course (Signed)
[name] is a 72 y.o.male with a history of HIV, squamous cell carcinoma of mouth, cirrhosis who was admitted to the [facility] Teaching [facility] at [facility] for wound infection. His hospital course is detailed below:  Wound infection Patient presented for worsening wound after being bit by a spider on 11/14.  Went to an urgent care and 11/16 and was started on Bactrim.  Wound culture from that time was positive for MRSA.  Patient was admitted for failed outpatient antibiotic treatment started on IV vancomycin.  Blood cultures were negative for 48 hours.  Patient was transitioned to oral linezolid after 48 hours and discharged with a total of a 7 day course of antibiotics. Wound care was consulted and provided recs for cleaning and wound coverage. Patient remained afebrile throughout admission and symptomatically improved by day of discharge.    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  Registered dietitian was consulted who managed patient's tube feeds throughout hospitalization.  Other chronic conditions were medically managed with home medications and formulary alternatives as necessary (HIV, SCC)  PCP Follow-up Recommendations: Malnutrition assessment  Ensure continued improvement of wound

## 2023-04-29 NOTE — ED Triage Notes (Signed)
Sister stated, We went to UC and was given an antibiotic for a bite on the lower left leg 3 days ago and I was suppose to take him a wound care but I didn't realize they are closed on Saturday. He needs his wound to be checked again

## 2023-04-29 NOTE — Progress Notes (Signed)
Pharmacy Antibiotic Note  [name] is a 72 y.o. male admitted on 04/29/2023 with  wound infection .  Pharmacy has been consulted for vancomycin dosing. Weight updated using standing weight per RN.   Plan: Vancomycin 1500 mg IV x1 received in the ED 11/23 AM > start vancomycin 1250 mg IV Q24h (eAUC 470, goal AUC 400-550, Scr 0.8, Vd 0.72) Trend WBC, fever, renal function F/u cultures, clinical progress, levels as indicated De-escalate when able Weight: 59.2 kg (130 lb 8 oz)  Temp (24hrs), Avg:97.9 F (36.6 C), Min:97.5 F (36.4 C), Max:98.2 F (36.8 C)  Recent Labs  Lab 04/29/23 1112  WBC 6.5  CREATININE 0.68    Estimated Creatinine Clearance: 69.9 mL/min (by C-G formula based on SCr of 0.68 mg/dL).    No Known Allergies   Microbiology results: 11/23 BCx: pending 11/19 Wound Cx: MRSA   Thank you for allowing pharmacy to be a part of this patient's care.  Carrington Clamp 04/29/2023 5:08 PM

## 2023-04-29 NOTE — Progress Notes (Signed)
Initial Nutrition Assessment  DOCUMENTATION CODES:   Not applicable  INTERVENTION:  Resume home tube feed regimen via G-tube: -Provide Osmolite 1.5 1 carton 6 times daily via bolus syringe -Flush tube with 90 mL water before and after each tube feeding -Provides: 2130 kcal, 89 grams of protein, 2166 mL H2O daily (1086 mL H2O from tube feeds + 1080 mL from flushes)  Tube feed regimen meets 100% RDIs for vitamins/minerals.  Recommend obtaining height and weight once admitted to floor.  NUTRITION DIAGNOSIS:   Inadequate oral intake related to inability to eat, dysphagia as evidenced by  (reliance on enteral nutrition and water flushes via G-tube to meet nutrition and hydration needs.).  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  RD is working remotely. Pt currently in ED pending bed placement inpatient. Unable to obtain nutrition history over the phone. Reviewed chart and noted pt is followed by outpatient RD Rhetta Mura, RD with [facility] and was seen on 04/20/23. Per review of outpatient RD note pt eats nothing by mouth but takes sips of water only. Has appointment for evaluation by SLP on 11/27 and is looking forward to possibly eating in the future. Current home tube feed regimen is Osmolite 1.5 6 cartons per day + 90 mL free water before and after each feed. DME is [employer]  Lucia Gaskins [phone]).   Weight at outpatient RD visit was 60.2 kg and per her notes weight had been trending up on current regimen. Per review of weight history available in our chart, pt was 88.5 kg on 07/05/18. Weights trending down. Pt was 76.7 kg on 07/26/22. Limited weight history available since 11/02/22 so unsure how much weight pt lost before regaining.  Enteral Access: G-tube  Medications reviewed and include: vancomycin  Labs reviewed.  No I/O documented (pt still in ED)  Suspect pt may meet criteria for malnutrition but unable to determine without full history or NFPE. Recommend assessing for malnutrition at nutrition follow-up.  NUTRITION - FOCUSED PHYSICAL EXAM:  Unable to complete as RD is working remotely  Diet Order:   Diet Order             Diet NPO time specified  Diet effective now                  EDUCATION NEEDS:   No education needs have been identified at this time  Skin:  Skin Assessment:  (RN skin assessment not yet completed as pt still in ED)  Last BM:  Unknown  Height:   Ht Readings from Last 1 Encounters:  11/02/22 5\' 11"  (1.803 m)   Weight:   Wt Readings from Last 1 Encounters:  11/02/22 70.3 kg   BMI:  There is no height or weight on file to calculate BMI.  Estimated Nutritional Needs:   Kcal:  1900-2100  Protein:  85-100 grams  Fluid:  1.9-2.1 L/day  Letta Median, MS, RD, LDN, CNSC Pager number available on Amion

## 2023-04-29 NOTE — Assessment & Plan Note (Signed)
Patient has been receiving feeds through his G-tube since his procedure for his mouth cancer.  Has not been eating by mouth.  Has lost a lot of weight during this time. -RD consulted, greatly appreciate their recommendations:  -Osmolite 1.5mg  6 times daily  -Free water flushes 6 times daily -Daily weights -Tube maintenance -Magnesium and phosphorus labs every 12 hours x 2 days to monitor for refeeding syndrome -AM BMP

## 2023-04-29 NOTE — H&P (Addendum)
Hospital Admission History and Physical Service Pager: [phone]  Patient name: [name] Medical record number: [id number] Date of Birth: 1951-04-10 Age: 72 y.o. Gender: male  Primary Care Provider: Patient, No Pcp Per Consultants: None Code Status: Full - confirmed with patient Preferred Emergency Contact: Lowella Dell (Sister): [phone]  Chief Complaint: Wound infection    Assessment and Plan: [name] is a 72 y.o. male presenting with worsening left leg wound secondary to presumed spider bite. Differential for this patient's presentation of this includes:   Cellulitis: Most likely etiology given purulent drainage, increased warmth, and erythema.  Vascular insufficiency ulcer: Possible etiology though patient's left lower extremity is warm and well-perfused on exam. Bacteremia: Less likely given patient has been afebrile without systemic symptoms. Pyoderma gangrenosum: Less likely given acute onset without history of similar symptoms Necrotizing fasciitis: Less likely.  No crepitus.  Afebrile without systemic symptoms.  Assessment & Plan Wound infection Patient-suspected spider bite wound on the lateral left lower leg. Wound found to be MRSA positive (bactrim sensitive) by urgent care, and patient was started on bactrim. Wound appearance and leg pain however have since worsened.  Afebrile on arrival without leukocytosis.  Received 1 dose of vancomycin in the ED.  Blood cultures x 2 drawn before vanc admin.  -Admit to FMTS with attending Dr. Pollie Meyer -Continue vancomycin per pharmacy -Wound care consult ordered -Vital signs per floor protocol -AM BMP, CBC On tube feeding diet Patient has been receiving feeds through his G-tube since his procedure for his mouth cancer.  Has not been eating by mouth.  Has lost a lot of weight during this time. -RD consulted, greatly appreciate their recommendations:  -Osmolite 1.5mg  6 times daily  -Free water flushes 6 times  daily -Daily weights -Tube maintenance -Magnesium and phosphorus labs every 12 hours x 2 days to monitor for refeeding syndrome -AM BMP  Chronic and Stable Conditions: HIV: Per most recent ID note, patient takes [place], Symtuza, Darrin Luis, and Abacavir/Prezcobix/TDF. However, patient's family reports he only takes two pills a day for HIV, unsure of the names.  Possibly Tivicay and Truvada? Need to clarify. Did get meds today per his sister, last viral load this month was undetectable Neck skin irritation: s/p radiation and skin graft, Apply Aquaphor as needed  FEN/GI: G-tube feeds per RD VTE Prophylaxis: Lovenox  Disposition: Admit to FMTS, Med-Surg  History of Present Illness:  [name] is a 72 y.o. male presenting with infected leg wound.   P patient and patient's sister believe the patient was bit by a spider on 11/14.the patient did not see the spider on him, but he saw it near him crawling away.  The spider bite started off as a small white dot, which got worse over time and became larger, swollen, warm, and purulent.  Patient went to an urgent care on 11/16 and was started on Bactrim at the time.  The wound however has only continued to worsen, with increasing leg pain and yellow drainage.  No recent fevers, chills, body aches, headaches.  Patient's family notes patient has been receiving tube feeds since he had surgery for his mouth cancer, and that the patient has lost a lot of weight during this time.  On arrival to ED, patient with stable vital signs.  Afebrile without leukocytosis.  BMP unremarkable.  Patient was given 1 dose of vancomycin (1500 mg). Blood cultures x2 prior to vanc were obtained.    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    Pertinent Past Surgical History: Reconstruction of jaw with partial resection of tongue d/t cancer (2024) Remainder reviewed in history tab.   Pertinent Social History: Tobacco use: Has smoked his whole life, but only 1 cigarette/day now  Alcohol use: Previously heavy drinking, stopped 5 months ago Other Substance use: No Lives with niece   Pertinent Family History: Sisters and parents- HTN, DM Remainder reviewed in history tab.   Important Outpatient Medications: Patient's sister reports patient takes only 2 medications for HIV every day.  Unsure of their names.  Per chart review and ID note from 04/14/23, patient taking Biktarvy, Symtuza, Genvoya, and Abacavir/Prezcobix/TDF.  Objective: BP 98/70   Pulse 84   Temp (!) 97.5 F (36.4 C) (Axillary)   Resp 18   SpO2 99%  Exam: General: No acute distress Eyes: Mild jaundice of bilateral sclera.  No scleral injection bilaterally. Neck: Patchy erythema of anterior neck and clavicular region, secondary to previous radiation treatments.  No bleeding or drainage.  Appears to be healing well. Cardiovascular: Normal S1/S2.  No extra heart sounds.  Warm and well-perfused. Respiratory: Breathing comfortably on room air.  CTAB.  No increased WOB. Gastrointestinal: Bowel sounds present.  Soft.  Nontender.  Nondistended. Ext: Open, purulent 2-3cm open wound of left lateral lower leg.  Malodorous.  Area around the wound with increased warmth and erythema.  Dry, peeling skin surrounding wound.  Pain to palpation around the wound area. Psych: Pleasant and appropriate.    Labs:  CBC BMET  Recent Labs  Lab 04/29/23 1112  WBC 6.5  HGB 11.9*  HCT 38.0*  PLT 414*   Recent Labs  Lab 04/29/23 1112  NA 135  K 4.6  CL 100  CO2 25  BUN 16  CREATININE 0.68  GLUCOSE 95  CALCIUM 9.6      Ivery Quale, MD 04/29/2023, 1:52 PM PGY-1, [facility]  FPTS Intern pager: [phone], text pages welcome Secure chat group [facility]  [facility] Service   I was personally present and re-performed the exam and medical decision making and verified the service and findings are accurately documented in the resident's note.  Erick Alley, DO 04/29/2023 8:01 PM

## 2023-04-29 NOTE — Progress Notes (Signed)
ED Pharmacy Antibiotic Sign Off An antibiotic consult was received from an ED provider for vancomycin per pharmacy dosing for  wound infection . A chart review was completed to assess appropriateness.  The following one time order(s) were placed per pharmacy consult:  vancomycin 1500 mg x 1 dose  Further antibiotic and/or antibiotic pharmacy consults should be ordered by the admitting provider if indicated.   Thank you for allowing pharmacy to be a part of this patient's care.   Delmar Landau, PharmD, BCPS 04/29/2023 12:50 PM ED Clinical Pharmacist -  [phone]

## 2023-04-29 NOTE — ED Provider Notes (Addendum)
[place] EMERGENCY DEPARTMENT AT [facility] Provider Note   CSN: [id number] Arrival date & time: 04/29/23  1914     History  Chief Complaint  Patient presents with   Insect Bite   Wound Check    [name] is a 72 y.o. male.  72 year old male presents today for concern of wound to the left leg that has not improved despite being on antibiotics for the past 3 days.  He has history of HIV and cirrhosis.  Denies fever or other complaint.  The history is provided by the patient. No language interpreter was used.       Home Medications Prior to Admission medications   Medication Sig Start Date End Date Taking? Authorizing Provider  bictegravir-emtricitabine-tenofovir AF (BIKTARVY) 50-200-25 MG TABS tablet Take 1 tablet by mouth daily. 11/02/22   Comer, Belia Heman, MD  sulfamethoxazole-trimethoprim (BACTRIM DS) 800-160 MG tablet Take 1 tablet by mouth 2 (two) times daily for 7 days. 04/25/23 05/02/23  Barbette Merino, NP      Allergies    Patient has no known allergies.    Review of Systems   Review of Systems  Constitutional:  Negative for chills and fever.  Skin:  Positive for wound.  Neurological:  Negative for light-headedness.  All other systems reviewed and are negative.   Physical Exam Updated Vital Signs BP 98/70   Pulse 84   Temp (!) 97.5 F (36.4 C) (Axillary)   Resp 18   SpO2 99%  Physical Exam Vitals and nursing note reviewed.  Constitutional:      General: He is not in acute distress.    Appearance: Normal appearance. He is not ill-appearing.  HENT:     Head: Normocephalic and atraumatic.     Nose: Nose normal.  Eyes:     Conjunctiva/sclera: Conjunctivae normal.  Cardiovascular:     Rate and Rhythm: Normal rate.  Pulmonary:     Effort: Pulmonary effort is normal. No respiratory distress.  Musculoskeletal:        General: No deformity.  Skin:    Findings: No rash.     Comments: Wound to left lower leg.  See attached picture  for description.  Distal warmth and minimal swelling noted.  Neurological:     Mental Status: He is alert.     ED Results / Procedures / Treatments   Labs (all labs ordered are listed, but only abnormal results are displayed) Labs Reviewed  CBC WITH DIFFERENTIAL/PLATELET - Abnormal; Notable for the following components:      Result Value   Hemoglobin 11.9 (*)    HCT 38.0 (*)    RDW 16.8 (*)    Platelets 414 (*)    Lymphs Abs 0.6 (*)    All other components within normal limits  CULTURE, BLOOD (ROUTINE X 2)  CULTURE, BLOOD (ROUTINE X 2)  BASIC METABOLIC PANEL    EKG None  Radiology No results found.  Procedures Procedures    Medications Ordered in ED Medications  vancomycin (VANCOREADY) IVPB 1500 mg/300 mL (1,500 mg Intravenous New Bag/Given 04/29/23 1303)    ED Course/ Medical Decision Making/ A&P                                 Medical Decision Making Amount and/or Complexity of Data Reviewed Labs: ordered.  Risk Prescription drug management. Decision regarding hospitalization.   72 year old male presents today for concern of wound  to the left lower leg.  See attached picture for description.  He had completed 3-day course of antibiotics outpatient so far without improvement.  Matter-of-fact he states that it has worsened somewhat.  Sister is at bedside and helps provide some history.  Afebrile.  Basic blood work obtained.  No leukocytosis.  Will obtain blood cultures and plan to admit patient for failure of outpatient therapy.  Discussed with admitting team.  They will evaluate patient for admission.  Vancomycin ordered.  Blood cultures ordered.  Final Clinical Impression(s) / ED Diagnoses Final diagnoses:  Wound infection    Rx / DC Orders ED Discharge Orders     None         Marita Kansas, PA-C 04/29/23 1337    Marita Kansas, PA-C 04/29/23 1337    Gloris Manchester, MD 04/29/23 1623

## 2023-04-29 NOTE — Plan of Care (Addendum)
FMTS Interim Progress Note  S: Resting comfortably in bed.  He has no concerns.  O: BP 111/74 (BP Location: Right Arm)   Pulse 79   Temp 98.5 F (36.9 C) (Oral)   Resp 18   Wt 59.2 kg   SpO2 99%   BMI 18.20 kg/m   General: Alert, in NAD HEENT: NCAT, EOM grossly normal, midline nasal septum Cardiac: RRR, no m/r/g appreciated Respiratory: CTAB, breathing comfortably on RA Extremities: Moves all extremities grossly equally Neurological: No gross focal deficit  A/P: Wound infection Patient remains afebrile.  Has Tylenol on for pain as needed, though does not appear to needed any doses yet.  Continue vancomycin and wound care.  Consider addition of cephalosporin as needed depending on improvement.  On tube feeding diet RD consulted.  Continue Osmolite and free water flushes.  Monitor labs per recommendations.  HIV Appears patient is stable on Tivicay and Truvada per infectious disease.  Last viral load earlier this month undetectable.  Will restart Tivicay and Descovy (formulary equivalent) per tube with first dose in AM.  Evette Georges, MD 04/29/2023, 9:22 PM PGY-2, [facility] Family Medicine Service pager [phone]
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
## 2023-04-29 NOTE — Assessment & Plan Note (Deleted)
Patient-suspected spider bite wound on the lateral left lower leg. Wound found to be MRSA positive (bactrim sensitive) by urgent care, and patient was started on bactrim. Wound appearance and leg pain however have since worsened.  Afebrile on arrival without leukocytosis.  Received 1 dose of vancomycin in the ED.  Blood cultures x 2 drawn before vanc admin.  -Admit to FMTS with attending Dr. Pollie Meyer -Continue vancomycin per pharmacy -Wound care consult ordered -Vital signs per floor protocol -AM BMP, CBC

## 2023-04-29 NOTE — Discharge Instructions (Addendum)
Dear [name],  Thank you for letting us participate in your care. You were hospitalized for an infection in your leg.  You were treated with IV antibiotics and your wound began to heal.   POST-HOSPITAL & CARE INSTRUCTIONS Continue taking the antibiotics through your feeding tube as prescribed for the next 5 days.    Please see your PCP in the next week for follow up.   Take care and be well!  Family Medicine Teaching Service Inpatient Team [place]  [facility]  [address] [place], [place] [address] [phone]

## 2023-04-29 NOTE — Plan of Care (Signed)
Patient alert/oriented X4. Patient skin assessed with Elisa RN, wound noted on LLE from presumable spider bite. 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 Patient VSS upon admission and initial standing weight acquired. No complaints at this time, G tube remains intact.   Problem: Education: Goal: Knowledge of General Education information will improve Description: Including pain rating scale, medication(s)/side effects and non-pharmacologic comfort measures Outcome: Progressing   Problem: Health Behavior/Discharge Planning: Goal: Ability to manage health-related needs will improve Outcome: Progressing   Problem: Clinical Measurements: Goal: Ability to maintain clinical measurements within normal limits will improve Outcome: Progressing   Problem: Clinical Measurements: Goal: Will remain free from infection Outcome: Progressing   Problem: Clinical Measurements: Goal: Diagnostic test results will improve Outcome: Progressing   Problem: Clinical Measurements: Goal: Respiratory complications will improve Outcome: Progressing   Problem: Clinical Measurements: Goal: Cardiovascular complication will be avoided Outcome: Progressing   Problem: Activity: Goal: Risk for activity intolerance will decrease Outcome: Progressing   Problem: Nutrition: Goal: Adequate nutrition will be maintained Outcome: Progressing   Problem: Coping: Goal: Level of anxiety will decrease Outcome: Progressing   Problem: Elimination: Goal: Will not experience complications related to bowel motility Outcome: Progressing   Problem: Elimination: Goal: Will not experience complications related to urinary retention Outcome: Progressing   Problem: Pain Management: Goal: General experience of comfort will improve Outcome: Progressing   Problem: Safety: Goal: Ability to remain free from injury will improve Outcome: Progressing   Problem: Skin Integrity: Goal: Risk  for impaired skin integrity will decrease Outcome: Progressing

## 2023-04-30 DIAGNOSIS — T148XXA Other injury of unspecified body region, initial encounter: Secondary | ICD-10-CM | POA: Diagnosis not present

## 2023-04-30 DIAGNOSIS — L089 Local infection of the skin and subcutaneous tissue, unspecified: Secondary | ICD-10-CM | POA: Diagnosis not present

## 2023-04-30 LAB — BASIC METABOLIC PANEL
Anion gap: 9 (ref 5–15)
BUN: 15 mg/dL (ref 8–23)
CO2: 25 mmol/L (ref 22–32)
Calcium: 9.5 mg/dL (ref 8.9–10.3)
Chloride: 102 mmol/L (ref 98–111)
Creatinine, Ser: 0.87 mg/dL (ref 0.61–1.24)
GFR, Estimated: >60 mL/min (ref >60)
Glucose, Bld: 91 mg/dL (ref 70–99)
Potassium: 4.9 mmol/L (ref 3.5–5.1)
Sodium: 136 mmol/L (ref 135–145)

## 2023-04-30 LAB — CBC
HCT: 39.5 % (ref 39.0–52.0)
Hemoglobin: 12.4 g/dL — ABNORMAL LOW (ref 13.0–17.0)
MCH: 26.7 pg (ref 26.0–34.0)
MCHC: 31.4 g/dL (ref 30.0–36.0)
MCV: 84.9 fL (ref 80.0–100.0)
Platelets: 402 10*3/uL — ABNORMAL HIGH (ref 150–400)
RBC: 4.65 MIL/uL (ref 4.22–5.81)
RDW: 17.2 % — ABNORMAL HIGH (ref 11.5–15.5)
WBC: 4 10*3/uL (ref 4.0–10.5)
nRBC: 0 % (ref 0.0–0.2)

## 2023-04-30 LAB — MAGNESIUM
Magnesium: 2.4 mg/dL (ref 1.7–2.4)
Magnesium: 2.1 mg/dL (ref 1.7–2.4)

## 2023-04-30 LAB — GLUCOSE, CAPILLARY
Glucose-Capillary: 99 mg/dL (ref 70–99)
Glucose-Capillary: 124 mg/dL — ABNORMAL HIGH (ref 70–99)
Glucose-Capillary: 149 mg/dL — ABNORMAL HIGH (ref 70–99)
Glucose-Capillary: 89 mg/dL (ref 70–99)
Glucose-Capillary: 115 mg/dL — ABNORMAL HIGH (ref 70–99)

## 2023-04-30 LAB — PHOSPHORUS
Phosphorus: 3.2 mg/dL (ref 2.5–4.6)
Phosphorus: 3.1 mg/dL (ref 2.5–4.6)

## 2023-04-30 MED ORDER — DOLUTEGRAVIR SODIUM 50 MG PO TABS
50.0000 mg | ORAL_TABLET | Freq: Every day | ORAL | Status: DC
  Administered 2023-04-30 – 2023-05-01 (×2): 50 mg
  Filled 2023-04-30 (×2): qty 1

## 2023-04-30 MED ORDER — EMTRICITABINE-TENOFOVIR DF 200-300 MG PO TABS
1.0000 | ORAL_TABLET | Freq: Every day | ORAL | Status: DC
  Administered 2023-04-30 – 2023-05-01 (×2): 1
  Filled 2023-04-30 (×2): qty 1

## 2023-04-30 NOTE — Assessment & Plan Note (Addendum)
Afebrile, and exam today improved from yesterday.  Subjectively patient improved. --Transition to oral doxycycline tomorrow -Continue vancomycin per pharmacy -Wound care consult ordered -AM BMP, CBC

## 2023-04-30 NOTE — Consult Note (Signed)
WOC Nurse Consult Note: this consult performed remotely utilizing EMR including photo documentation  Reason for Consult: L lateral leg wound  Wound type: full thickness infectious ? Etiology reported spider bite  Pressure Injury POA: NA  Measurement: see nursing flowsheet  Wound bed: 75% yellow 25% red  Drainage (amount, consistency, odor) see nursing flowsheet  Periwound:  warmth and erythema; peeling skin  Dressing procedure/ placement/frequency: Cleanse L lateral lower leg wound with Vashe wound cleanser [facility] [place] [phone]), apply Vashe moistened gauze to wound bed twice daily.  Cover with dry gauze and silicone foam or Kerlix roll gauze whichever is preferred.    Patient is receiving IV antibiotics for wound infection.   POC discussed with bedside nurse. WOC team will not follow. Re-consult if further needs arise.   Thank you,    Priscella Mann MSN, RN-BC, [employer] [phone]

## 2023-04-30 NOTE — Assessment & Plan Note (Addendum)
Continue feeds per G-tube.  RD consulted, recs appreciated. -RD consulted, greatly appreciate their recommendations:  -Osmolite 1.5mg  6 times daily  -Free water flushes 6 times daily -Daily weights -Tube maintenance -Magnesium and phosphorus labs every 12 hours x 2 days to monitor for refeeding syndrome -AM BMP

## 2023-04-30 NOTE — Progress Notes (Signed)
     Daily Progress Note Intern Pager: [phone]  Patient name: [name] Medical record number: [id number] Date of birth: 08/08/1950 Age: 72 y.o. Gender: male  Primary Care Provider: Patient, No Pcp Per Consultants: None Code Status: Full  Pt Overview and Major Events to Date:  -04/29/23 Admitted   Assessment and Plan:  [name] is 72 y.o. male admitted for cellulitis and failed outpatient antibiotic treatment. Pertinent PMH/PSH includes HIV, oropharyngeal SCC, hep B/C, TB, AVN of hip, Cirrhosis. Assessment & Plan Wound infection Afebrile, and exam today improved from yesterday.  Subjectively patient improved. --Transition to oral doxycycline tomorrow -Continue vancomycin per pharmacy -Wound care consult ordered -AM BMP, CBC On tube feeding diet Continue feeds per G-tube.  RD consulted, recs appreciated. -RD consulted, greatly appreciate their recommendations:  -Osmolite 1.5mg  6 times daily  -Free water flushes 6 times daily -Daily weights -Tube maintenance -Magnesium and phosphorus labs every 12 hours x 2 days to monitor for refeeding syndrome -AM BMP   Chronic and Stable Issues: HIV: Discussed with Jani Gravel, RPH per dispense history patient taking Tivicay and Truvada will restart today. Neck skin irritation: s/p radiation and skin graft, Apply Aquaphor as needed  FEN/GI: G-tube feeds PPx: Lovenox Dispo:Pending transition to oral antibiotics  Subjective:  No acute events overnight.  Patient feels that his wound is doing much better.  He is very pleasant and would like leave the hospital but wants to make sure that his leg is doing better first.  Objective: Temp:  [97.5 F (36.4 C)-98.5 F (36.9 C)] 97.5 F (36.4 C) (11/24 0714) Pulse Rate:  [72-105] 81 (11/24 0714) Resp:  [16-18] 16 (11/24 0714) BP: (97-125)/(66-89) 97/68 (11/24 0714) SpO2:  [98 %-100 %] 98 % (11/24 0714) Weight:  [59.2 kg] 59.2 kg (11/23 1600) Physical Exam: General: NAD   Neuro: A&O Cardiovascular: RRR, no murmurs, no peripheral edema Respiratory: normal WOB on RA, CTAB, no wheezes, ronchi or rales Extremities: Moving all 4 extremities equally, left lower extremity wound, nontender to palpation, continued purulent drainage, mild fluctuance, no obvious erythematous border   Laboratory: Most recent CBC Lab Results  Component Value Date   WBC 4.0 04/30/2023   HGB 12.4 (L) 04/30/2023   HCT 39.5 04/30/2023   MCV 84.9 04/30/2023   PLT 402 (H) 04/30/2023   Most recent BMP    Latest Ref Rng & Units 04/30/2023    4:09 AM  BMP  Glucose 70 - 99 mg/dL 91   BUN 8 - 23 mg/dL 15   Creatinine 3.08 - 1.24 mg/dL 6.57   Sodium 846 - 962 mmol/L 136   Potassium 3.5 - 5.1 mmol/L 4.9   Chloride 98 - 111 mmol/L 102   CO2 22 - 32 mmol/L 25   Calcium 8.9 - 10.3 mg/dL 9.5     Other pertinent labs: none   Imaging/Diagnostic Tests: None  Celine Mans, MD 04/30/2023, 8:02 AM  PGY-2, [facility] FPTS Intern pager: [phone], text pages welcome Secure chat group [facility] Teaching Service

## 2023-05-01 ENCOUNTER — Other Ambulatory Visit (HOSPITAL_COMMUNITY): Payer: Self-pay

## 2023-05-01 ENCOUNTER — Encounter (HOSPITAL_COMMUNITY): Payer: Self-pay | Admitting: Family Medicine

## 2023-05-01 DIAGNOSIS — L089 Local infection of the skin and subcutaneous tissue, unspecified: Secondary | ICD-10-CM | POA: Diagnosis not present

## 2023-05-01 DIAGNOSIS — T148XXA Other injury of unspecified body region, initial encounter: Secondary | ICD-10-CM | POA: Diagnosis not present

## 2023-05-01 LAB — CBC
HCT: 38.8 % — ABNORMAL LOW (ref 39.0–52.0)
Hemoglobin: 11.9 g/dL — ABNORMAL LOW (ref 13.0–17.0)
MCH: 26.2 pg (ref 26.0–34.0)
MCHC: 30.7 g/dL (ref 30.0–36.0)
MCV: 85.5 fL (ref 80.0–100.0)
Platelets: 408 10*3/uL — ABNORMAL HIGH (ref 150–400)
RBC: 4.54 MIL/uL (ref 4.22–5.81)
RDW: 17 % — ABNORMAL HIGH (ref 11.5–15.5)
WBC: 4 10*3/uL (ref 4.0–10.5)
nRBC: 0 % (ref 0.0–0.2)

## 2023-05-01 LAB — MAGNESIUM: Magnesium: 2.2 mg/dL (ref 1.7–2.4)

## 2023-05-01 LAB — BASIC METABOLIC PANEL
Anion gap: 7 (ref 5–15)
BUN: 15 mg/dL (ref 8–23)
CO2: 25 mmol/L (ref 22–32)
Calcium: 9.5 mg/dL (ref 8.9–10.3)
Chloride: 102 mmol/L (ref 98–111)
Creatinine, Ser: 0.7 mg/dL (ref 0.61–1.24)
GFR, Estimated: >60 mL/min (ref >60)
Glucose, Bld: 92 mg/dL (ref 70–99)
Potassium: 4.9 mmol/L (ref 3.5–5.1)
Sodium: 134 mmol/L — ABNORMAL LOW (ref 135–145)

## 2023-05-01 LAB — GLUCOSE, CAPILLARY
Glucose-Capillary: 107 mg/dL — ABNORMAL HIGH (ref 70–99)
Glucose-Capillary: 128 mg/dL — ABNORMAL HIGH (ref 70–99)
Glucose-Capillary: 162 mg/dL — ABNORMAL HIGH (ref 70–99)
Glucose-Capillary: 85 mg/dL (ref 70–99)

## 2023-05-01 LAB — PHOSPHORUS: Phosphorus: 3.3 mg/dL (ref 2.5–4.6)

## 2023-05-01 MED ORDER — LINEZOLID 600 MG PO TABS
600.0000 mg | ORAL_TABLET | Freq: Two times a day (BID) | ORAL | 0 refills | Status: AC
  Filled 2023-05-01: qty 10, 5d supply, fill #0

## 2023-05-01 NOTE — TOC Transition Note (Signed)
Transition of Care [facility]) - CM/SW Discharge Note   Patient Details  Name: [name] MRN: [id number] Date of Birth: [birth year]  Transition of Care [facility]) CM/SW Contact:  Harriet Masson, RN Phone Number: 05/01/2023, 2:38 PM   Clinical Narrative:    Patient stable for discharge.  Patient agreeable for TOC to make PCP apt. Apt added to AVS. Patient's sister will transport home.         Patient Goals and CMS Choice  Return home    Discharge Placement    home                     Discharge Plan and Services Additional resources added to the After Visit Summary for                                       Social Determinants of Health (SDOH) Interventions SDOH Screenings   Food Insecurity: No Food Insecurity (04/29/2023)  Housing: Low Risk  (04/29/2023)  Transportation Needs: No Transportation Needs (04/29/2023)  Utilities: Not At Risk (04/29/2023)  Depression (PHQ2-9): Low Risk  (08/25/2021)  Tobacco Use: High Risk (05/01/2023)     Readmission Risk Interventions    05/01/2023    2:38 PM  Readmission Risk Prevention Plan  Post Dischage Appt Complete  Medication Screening Complete  Transportation Screening Complete

## 2023-05-01 NOTE — Progress Notes (Signed)
     Daily Progress Note Intern Pager: [phone]  Patient name: [name] Medical record number: [id number] Date of birth: 1951-02-10 Age: 72 y.o. Gender: male  Primary Care Provider: Patient, No Pcp Per Consultants: Wound care Code Status: Full  Pt Overview and Major Events to Date:  11/23: Admitted to FMTS  Assessment and Plan:  [name] is a 72 y.o. male presenting with worsening left leg wound secondary to presumed spider bite.  S/p 2 days of vancomycin with stable vitals and improving symptoms.  Assessment & Plan Wound infection Afebrile, VSS. Improving exam. Hgb stable. Patient feels better and is ambulating independently. S/p 2 days IV Vanc.  --Transition to linezolid  via tube for 5 days, for total 7 day course of abx  -Wound care consulted, appreciate their recs for wound cleaning and wrapping On tube feeding diet Continue feeds per G-tube.  RD consulted, recs appreciated. Magnesium and Phos trended normal x 2 days. -RD consulted, greatly appreciate their recommendations:  -Osmolite 1.5mg  6 times daily  -Free water flushes 6 times daily -Daily weights -Tube maintenance  Chronic and Stable Issues: HIV: Continue home Tivicay and Truvada  Neck skin irritation: s/p radiation and skin graft, Aquaphor as needed  FEN/GI: Cont tube feeds per RD PPx: Lovenox  Dispo: Home pending transition to PO abx   Subjective:  Patient doing well this morning. He feels his leg wound has been improving. He has been ambulating independently without issue. Would like to go home.   Objective: Temp:  [97.4 F (36.3 C)-98 F (36.7 C)] 97.6 F (36.4 C) (11/25 0722) Pulse Rate:  [76-83] 81 (11/25 0722) Resp:  [16-18] 16 (11/25 0722) BP: (96-111)/(67-78) 111/78 (11/25 0722) SpO2:  [98 %-100 %] 100 % (11/25 0722) General: Well-appearing. Resting comfortably in room. CV: Normal S1/S2. No extra heart sounds. Warm and well-perfused. Pulm: Breathing comfortably on room air.  CTAB. No increased WOB. Abd: Soft, non-tender, non-distended. Ext:  Improving LLE wound, with decreased erythema and purulence. Nontender to palpation surrounding wound. 3+ distal pulses bilaterally.  Psych: Pleasant and appropriate.   Laboratory: Most recent CBC Lab Results  Component Value Date   WBC 4.0 05/01/2023   HGB 11.9 (L) 05/01/2023   HCT 38.8 (L) 05/01/2023   MCV 85.5 05/01/2023   PLT 408 (H) 05/01/2023   Most recent BMP    Latest Ref Rng & Units 05/01/2023    4:53 AM  BMP  Glucose 70 - 99 mg/dL 92   BUN 8 - 23 mg/dL 15   Creatinine 1.47 - 1.24 mg/dL 8.29   Sodium 562 - 130 mmol/L 134   Potassium 3.5 - 5.1 mmol/L 4.9   Chloride 98 - 111 mmol/L 102   CO2 22 - 32 mmol/L 25   Calcium 8.9 - 10.3 mg/dL 9.5    Ivery Quale, MD 05/01/2023, 7:59 AM  PGY-1, [place] [facility] FPTS Intern pager: [phone], text pages welcome Secure chat group [facility] Teaching Service

## 2023-05-01 NOTE — Assessment & Plan Note (Addendum)
Continue feeds per G-tube.  RD consulted, recs appreciated. Magnesium and Phos trended normal x 2 days. -RD consulted, greatly appreciate their recommendations:  -Osmolite 1.5mg  6 times daily  -Free water flushes 6 times daily -Daily weights -Tube maintenance

## 2023-05-01 NOTE — Assessment & Plan Note (Addendum)
Afebrile, VSS. Improving exam. Hgb stable. Patient feels better and is ambulating independently. S/p 2 days IV Vanc.  --Transition to linezolid  via tube for 5 days, for total 7 day course of abx  -Wound care consulted, appreciate their recs for wound cleaning and wrapping

## 2023-05-01 NOTE — Plan of Care (Signed)
Patient alert/oriented X4. Patient wound dressing changed with proper wound care education for how to change the wound dressing delivered to patient. Patient tolerated tube feeds and medication administration. PIV removed prior to discharge. Pending transport down to discharge lounge. AVS instructions explained in detail to patient, VSS.  Problem: Education: Goal: Knowledge of General Education information will improve Description: Including pain rating scale, medication(s)/side effects and non-pharmacologic comfort measures Outcome: Adequate for Discharge   Problem: Health Behavior/Discharge Planning: Goal: Ability to manage health-related needs will improve Outcome: Adequate for Discharge   Problem: Clinical Measurements: Goal: Ability to maintain clinical measurements within normal limits will improve Outcome: Adequate for Discharge   Problem: Clinical Measurements: Goal: Will remain free from infection Outcome: Adequate for Discharge   Problem: Clinical Measurements: Goal: Diagnostic test results will improve Outcome: Adequate for Discharge   Problem: Clinical Measurements: Goal: Respiratory complications will improve Outcome: Adequate for Discharge   Problem: Clinical Measurements: Goal: Cardiovascular complication will be avoided Outcome: Adequate for Discharge   Problem: Activity: Goal: Risk for activity intolerance will decrease Outcome: Adequate for Discharge   Problem: Nutrition: Goal: Adequate nutrition will be maintained Outcome: Adequate for Discharge   Problem: Elimination: Goal: Will not experience complications related to bowel motility Outcome: Adequate for Discharge   Problem: Elimination: Goal: Will not experience complications related to urinary retention Outcome: Adequate for Discharge   Problem: Pain Management: Goal: General experience of comfort will improve Outcome: Adequate for Discharge   Problem: Safety: Goal: Ability to remain free from  injury will improve Outcome: Adequate for Discharge   Problem: Skin Integrity: Goal: Risk for impaired skin integrity will decrease Outcome: Adequate for Discharge

## 2023-05-01 NOTE — Discharge Summary (Addendum)
Family Medicine Teaching [facility] Discharge Summary  Patient name: [name] Medical record number: [id number] Date of birth: September 15, 1950 Age: 72 y.o. Gender: male Date of Admission: 04/29/2023  Date of Discharge: 05/01/23 Admitting Physician: Latrelle Dodrill, MD  Primary Care Provider: Patient, No Pcp Per Consultants: Wound care  Indication for Hospitalization: Wound infection  Discharge Diagnoses/Problem List:  Principal Problem for Admission: Wound infection Other Problems addressed during stay:  Principal Problem:   Wound infection Active Problems:   On tube feeding diet  Brief Hospital Course:  [name] is a 72 y.o.male with a history of HIV, squamous cell carcinoma of mouth, cirrhosis who was admitted to the [facility] Medicine Teaching Service at [facility] for wound infection. His hospital course is detailed below:  Wound infection Patient presented for worsening wound after being bit by a spider on 11/14.  Went to an urgent care and 11/16 and was started on Bactrim.  Wound culture from that time was positive for MRSA.  Patient was admitted for failed outpatient antibiotic treatment started on IV vancomycin.  Blood cultures were negative for 48 hours.  Patient was transitioned to oral linezolid after 48 hours and discharged with a total of a 7 day course of antibiotics. Wound care was consulted and provided recs for cleaning and wound coverage. Patient remained afebrile throughout admission and symptomatically improved by day of discharge.    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  Registered dietitian was consulted who managed patient's tube feeds throughout hospitalization.  Other chronic conditions were medically managed with home medications and formulary alternatives as necessary (HIV, SCC)  PCP Follow-up Recommendations: Malnutrition assessment  Ensure continued improvement of  wound  Disposition: Home  Discharge Condition: Improved and stable  Discharge Exam:  Vitals:   05/01/23 0609 05/01/23 0722  BP: 104/67 111/78  Pulse: 76 81  Resp: 18 16  Temp: (!) 97.5 F (36.4 C) 97.6 F (36.4 C)  SpO2: 98% 100%   General: Well-appearing. Resting comfortably in room. CV: Normal S1/S2. No extra heart sounds. Warm and well-perfused. Pulm: Breathing comfortably on room air. CTAB. No increased WOB. Abd: Soft, non-tender, non-distended. Ext:  Improving LLE wound, with decreased erythema and purulence. Nontender to palpation surrounding wound. 3+ distal pulses bilaterally.  Psych: Pleasant and appropriate.   Significant Procedures: None  Significant Labs and Imaging:  Recent Labs  Lab 04/30/23 0409 05/01/23 0453  WBC 4.0 4.0  HGB 12.4* 11.9*  HCT 39.5 38.8*  PLT 402* 408*   Recent Labs  Lab 04/29/23 1656 04/30/23 0409 04/30/23 1652 05/01/23 0453  NA  --  136  --  134*  K  --  4.9  --  4.9  CL  --  102  --  102  CO2  --  25  --  25  GLUCOSE  --  91  --  92  BUN  --  15  --  15  CREATININE  --  0.87  --  0.70  CALCIUM  --  9.5  --  9.5  MG 2.3 2.4 2.1 2.2  PHOS 3.1 3.2 3.1 3.3    Results/Tests Pending at Time of Discharge: Blood cultures (Ng2d)  Discharge Medications:  Allergies as of 05/01/2023   No Known Allergies      Medication List     STOP taking these medications    sulfamethoxazole-trimethoprim 800-160 MG tablet Commonly known as: BACTRIM DS  TAKE these medications    emtricitabine-tenofovir 200-300 MG tablet Commonly known as: TRUVADA Place 1 tablet into feeding tube daily.   linezolid 600 MG tablet Commonly known as: ZYVOX Place 1 tablet (600 mg total) into feeding tube 2 (two) times daily for 5 days.   mineral oil-hydrophilic petrolatum ointment Apply 1 Application topically 2 (two) times daily as needed (wound care (neck)).   oxyCODONE 5 MG immediate release tablet Commonly known as: Oxy  IR/ROXICODONE Place 5-10 mg into feeding tube every 4 (four) hours as needed for severe pain (pain score 7-10).   scopolamine 1 MG/3DAYS Commonly known as: TRANSDERM-SCOP Place 1 patch onto the skin every 3 (three) days.   senna 8.6 MG Tabs tablet Commonly known as: SENOKOT Place 1 tablet into feeding tube every other day.   silver sulfADIAZINE 1 % cream Commonly known as: SILVADENE Apply 1 Application topically 2 (two) times daily as needed (wound care (neck)).   Tivicay 50 MG tablet Generic drug: dolutegravir 50 mg See admin instructions. Take 50 mg (1 tablet) daily via tube.        Discharge Instructions: Please refer to Patient Instructions section of EMR for full details.  Patient was counseled important signs and symptoms that should prompt return to medical care, changes in medications, dietary instructions, activity restrictions, and follow up appointments.   Follow-Up Appointments:  Follow-up Information     Moshe Cipro, FNP. Go to.   Specialty: Family Medicine Why: Hospital follow up New PCP apt @ December 9th @ 1pm. Arrive 20 mins early to complete paperwork. Contact information: [address] [place] [address] [phone]                Future Appointments  Date Time Provider Department Center  05/15/2023  1:00 PM Moshe Cipro, FNP [facility] None   Recommend follow up with PCP within next 1 week.   Ivery Quale PGY-1, [facility]   I reviewed the medical decision making and verified the service and findings are accurately documented in the resident's note.  Erick Alley, DO 05/01/2023 3:50 PM

## 2023-05-03 ENCOUNTER — Other Ambulatory Visit (HOSPITAL_COMMUNITY): Payer: Self-pay

## 2023-05-04 LAB — CULTURE, BLOOD (ROUTINE X 2)
Culture  Setup Time: NONE SEEN
Culture: NO GROWTH
Culture: NO GROWTH
Special Requests: ADEQUATE

## 2023-05-15 ENCOUNTER — Ambulatory Visit: Payer: Medicare Other | Admitting: Family Medicine
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
## 2023-06-13 ENCOUNTER — Telehealth: Payer: Self-pay

## 2023-06-13 NOTE — Telephone Encounter (Signed)
 Called pt to advise time to renew financial application and to schedule an appt.  Call said could not be completed try again later.

## 2023-07-28 ENCOUNTER — Telehealth: Payer: Self-pay

## 2023-07-28 NOTE — Telephone Encounter (Signed)
 Received fax from [facility] to make Dr. [name] aware of contraindication of Biktarvy use in patient's with severe hepatic disease. Patient has transferred care to [facility].   Called [facility] to make sure they had patient's new ID provider info on file. They confirm patient is being prescribed Tivicay and Truvada by provider Jeanmarie Hubert with [facility].   Sandie Ano, RN

## 2023-08-03 ENCOUNTER — Encounter (HOSPITAL_COMMUNITY): Payer: Self-pay

## 2023-08-03 ENCOUNTER — Other Ambulatory Visit: Payer: Self-pay

## 2023-08-03 ENCOUNTER — Emergency Department (HOSPITAL_COMMUNITY)
Admission: EM | Admit: 2023-08-03 | Discharge: 2023-08-04 | Disposition: A | Discharge status: Home / Self Care | Payer: Medicare Other | Attending: Emergency Medicine | Admitting: Emergency Medicine

## 2023-08-03 DIAGNOSIS — K9420 Gastrostomy complication, unspecified: Secondary | ICD-10-CM | POA: Insufficient documentation

## 2023-08-03 DIAGNOSIS — Z21 Asymptomatic human immunodeficiency virus [HIV] infection status: Secondary | ICD-10-CM | POA: Diagnosis not present

## 2023-08-03 DIAGNOSIS — Z85819 Personal history of malignant neoplasm of unspecified site of lip, oral cavity, and pharynx: Secondary | ICD-10-CM | POA: Diagnosis not present

## 2023-08-03 NOTE — ED Triage Notes (Signed)
 Pt came in via POV d/t his feeding tube coming out of his abd this morning, the provider he sees for this in [place] sent him here. Family does report a substance that was white a "puss like" coming out of it. Denies any current pain in the area around the area.

## 2023-08-03 NOTE — ED Notes (Signed)
 Initially unable to get a temp while being triaged out in the lobby.

## 2023-08-03 NOTE — ED Provider Triage Note (Signed)
 Emergency Medicine Provider Triage Evaluation Note  [name] , a 73 y.o. male  was evaluated in triage.  Pt complains of G-tube malfunction.  States that he pulled it out with his sugar earlier today.  Denies any pain..  Review of Systems  Positive: See above Negative:   Physical Exam  BP 118/77 (BP Location: Right Arm)   Pulse 76   Resp 18   Ht 5\' 11"  (1.803 m)   Wt 60.3 kg   SpO2 96%   BMI 18.55 kg/m  Gen:   Awake, no distress   Resp:  Normal effort  MSK:   Moves extremities without difficulty  Other:    Medical Decision Making  Medically screening exam initiated at 7:01 PM.  Appropriate orders placed.  [name] was informed that the remainder of the evaluation will be completed by another provider, this initial triage assessment does not replace that evaluation, and the importance of remaining in the ED until their evaluation is complete.  Patient uses a 20 [place] tube.   Peter Garter, [place] 08/03/23 1902

## 2023-08-04 ENCOUNTER — Emergency Department (HOSPITAL_COMMUNITY): Payer: Medicare Other

## 2023-08-04 MED ORDER — DIATRIZOATE MEGLUMINE & SODIUM 66-10 % PO SOLN
ORAL | Status: AC
Start: 2023-08-04 — End: ?
  Filled 2023-08-04: qty 30

## 2023-08-04 MED ORDER — DIATRIZOATE MEGLUMINE & SODIUM 66-10 % PO SOLN
30.0000 mL | Freq: Once | ORAL | Status: AC
  Administered 2023-08-04: 30 mL
  Filled 2023-08-04: qty 30

## 2023-08-04 NOTE — Discharge Instructions (Addendum)
 Your g-tube is back within normal position. Follow up with your general surgeon in the next several days for reevaluation.  Return to the ED if the G-tube comes out again or if there's an obstruction in the line.

## 2023-08-04 NOTE — ED Provider Notes (Signed)
 [place] EMERGENCY DEPARTMENT AT [facility] Provider Note   CSN: [id number] Arrival date & time: 08/03/23  1720     History  Chief Complaint  Patient presents with   Feeding Tube Came Out    [name] is a 73 y.o. male with a history of HIV, mouth cancer, and substance abuse who presents the ED today with his sister for feeding tube concerns.  Yesterday morning at around 8 AM patient was taking off his shirt and accidentally pulled out his  G-tube. Patient has the tube with him. States that there is some secretions around the site of where the tube came out.  Denies any associated abdominal pain or fevers.  No issues with the tube prior to coming out. Had the gastrostomy tube placed with Dr. Lowell Guitar with [facility] 01/03/2023. No additional concerns or complaints at this time.    Home Medications Prior to Admission medications   Medication Sig Start Date End Date Taking? Authorizing Provider  dolutegravir (TIVICAY) 50 MG tablet 50 mg See admin instructions. Take 50 mg (1 tablet) daily via tube.    [provider]  emtricitabine-tenofovir (TRUVADA) 200-300 MG tablet Place 1 tablet into feeding tube daily.    [provider]  ibuprofen (ADVIL) 100 MG/5ML suspension Place 200 mg into feeding tube every 4 (four) hours as needed for moderate pain (pain score 4-6). 06/30/23   [provider]  mineral oil-hydrophilic petrolatum (AQUAPHOR) ointment Apply 1 Application topically 2 (two) times daily as needed (wound care (neck)).    [provider]  oxyCODONE (OXY IR/ROXICODONE) 5 MG immediate release tablet Place 5-10 mg into feeding tube every 4 (four) hours as needed for severe pain (pain score 7-10).    [provider]  scopolamine (TRANSDERM-SCOP) 1 MG/3DAYS Place 1 patch onto the skin every 3 (three) days.    [provider]  senna (SENOKOT) 8.6 MG TABS tablet Place 1 tablet into feeding tube every other day.    [provider]  silver sulfADIAZINE (SILVADENE) 1 % cream Apply 1 Application topically 2 (two) times daily as needed (wound care (neck)).    [provider]      Allergies    Patient has no known allergies.    Review of Systems   Review of Systems  Gastrointestinal:        Feeding tube came out  All other systems reviewed and are negative.   Physical Exam Updated Vital Signs BP 128/74   Pulse 64   Temp 97.7 F (36.5 C)   Resp 18   Ht 5\' 11"  (1.803 m)   Wt 60.3 kg   SpO2 100%   BMI 18.55 kg/m  Physical Exam Vitals and nursing note reviewed.  Constitutional:      Appearance: Normal appearance.  HENT:     Head: Normocephalic and atraumatic.     Mouth/Throat:     Mouth: Mucous membranes are moist.  Eyes:     Conjunctiva/sclera: Conjunctivae normal.     Pupils: Pupils are equal, round, and reactive to light.  Cardiovascular:     Rate and Rhythm: Normal rate and regular rhythm.     Pulses: Normal pulses.  Pulmonary:     Effort: Pulmonary effort is normal.     Breath sounds: Normal breath sounds.  Abdominal:     Palpations: Abdomen is soft.     Tenderness: There is no abdominal tenderness.  Skin:    General: Skin is warm and dry.  Findings: No rash.  Neurological:     General: No focal deficit present.     Mental Status: He is alert.  Psychiatric:        Mood and Affect: Mood normal.        Behavior: Behavior normal.     ED Results / Procedures / Treatments   Labs (all labs ordered are listed, but only abnormal results are displayed) Labs Reviewed - No data to display  EKG None  Radiology DG ABDOMEN PEG TUBE LOCATION Result Date: 08/04/2023 CLINICAL DATA:  Gastrostomy tube. EXAM: ABDOMEN - 1 VIEW COMPARISON:  None Available. FINDINGS: Distal tip of gastric tube appears to be within gastric lumen. Normal contrast filling of the gastric lumen is noted without extravasation. No abnormal bowel dilatation is noted. IMPRESSION: Distal tip of  gastrostomy tube appears to be within gastric lumen. Electronically Signed   By: Lupita Raider M.D.   On: 08/04/2023 09:55    Procedures Procedures   Medications Ordered in ED Medications  diatrizoate meglumine-sodium (GASTROGRAFIN) 66-10 % solution 30 mL (30 mLs Per Tube Given 08/04/23 0810)    ED Course/ Medical Decision Making/ A&P                                 Medical Decision Making Amount and/or Complexity of Data Reviewed Radiology: ordered.  Risk Prescription drug management.   This patient presents to the ED for concern of feeding tube came out, this involves an extensive number of treatment options, and is a complaint that carries with it a high risk of complications and morbidity.   Differential diagnosis includes: partial vs completely removed g-tube, etc.   Comorbidities  See HPI above   Additional History  Additional history obtained from prior records   Imaging Studies  I ordered imaging studies including abdomen x-ray  I independently visualized and interpreted imaging which showed: Distal tip of gastrostomy tube appears to be within gastric lumen I agree with the radiologist interpretation   Consultations  I requested consultation with interventional radiology,  and discussed lab and imaging findings as well as pertinent plan - they recommend:  Secure chat them if we cannot get the g-tube back in here in the ED, then they will do it.   Problem List / ED Course / Critical Interventions / Medication Management  Gastrostomy tube came out yesterday morning while taking off his shirt. G-tube was initially placed in 12/2022 with [facility]. Patient staffed with my attending, Dr. Anitra Lauth.  She was able to place a new 20 [place] G-tube.  Position was verified by abdominal x-ray. I have reviewed the patients home medicines and have made adjustments as needed   Social Determinants of Health  History of tobacco use   Test / Admission -  Considered  Discussed findings with patient and sister at bedside.  All questions were answered. He is hemodynamically stable and safe for discharge home. Return precautions given.       Final Clinical Impression(s) / ED Diagnoses Final diagnoses:  Complication of gastrostomy tube [facility])    Rx / DC Orders ED Discharge Orders     None         Maxwell Marion, PA-C 08/04/23 1006    Gwyneth Sprout, MD 08/06/23 [phone]

## 2023-12-19 ENCOUNTER — Other Ambulatory Visit: Payer: Self-pay | Admitting: Pharmacist

## 2023-12-19 DIAGNOSIS — B20 Human immunodeficiency virus [HIV] disease: Secondary | ICD-10-CM

## 2023-12-20 NOTE — Telephone Encounter (Signed)
 Patient is seen by [facility] ID.   [name], BSN, RN

## 2024-07-04 ENCOUNTER — Ambulatory Visit
